# Patient Record
Sex: Female | Born: 1975 | Race: Black or African American | Hispanic: No | Marital: Single | Smoking: Never smoker
Health system: Southern US, Community
[De-identification: ages and names within clinical notes are randomized; demographics above are authoritative.]

## PROBLEM LIST (undated history)

## (undated) ENCOUNTER — Inpatient Hospital Stay (HOSPITAL_COMMUNITY): Payer: Self-pay

## (undated) DIAGNOSIS — R011 Cardiac murmur, unspecified: Secondary | ICD-10-CM

## (undated) DIAGNOSIS — K219 Gastro-esophageal reflux disease without esophagitis: Secondary | ICD-10-CM

## (undated) DIAGNOSIS — J45909 Unspecified asthma, uncomplicated: Secondary | ICD-10-CM

## (undated) DIAGNOSIS — D219 Benign neoplasm of connective and other soft tissue, unspecified: Secondary | ICD-10-CM

## (undated) HISTORY — DX: Gastro-esophageal reflux disease without esophagitis: K21.9

## (undated) HISTORY — DX: Cardiac murmur, unspecified: R01.1

## (undated) HISTORY — PX: OTHER SURGICAL HISTORY: SHX169

## (undated) HISTORY — PX: UPPER GASTROINTESTINAL ENDOSCOPY: SHX188

---

## 2005-06-25 ENCOUNTER — Emergency Department (HOSPITAL_COMMUNITY): Admission: EM | Admit: 2005-06-25 | Discharge: 2005-06-25 | Payer: Self-pay | Admitting: Family Medicine

## 2005-07-25 ENCOUNTER — Other Ambulatory Visit: Admission: RE | Admit: 2005-07-25 | Discharge: 2005-07-25 | Payer: Self-pay | Admitting: Obstetrics and Gynecology

## 2006-02-22 ENCOUNTER — Inpatient Hospital Stay (HOSPITAL_COMMUNITY): Admission: RE | Admit: 2006-02-22 | Discharge: 2006-02-24 | Payer: Self-pay | Admitting: Obstetrics and Gynecology

## 2006-03-30 ENCOUNTER — Emergency Department (HOSPITAL_COMMUNITY): Admission: EM | Admit: 2006-03-30 | Discharge: 2006-03-30 | Payer: Self-pay | Admitting: Emergency Medicine

## 2006-04-02 ENCOUNTER — Other Ambulatory Visit: Admission: RE | Admit: 2006-04-02 | Discharge: 2006-04-02 | Payer: Self-pay | Admitting: Obstetrics and Gynecology

## 2007-05-09 ENCOUNTER — Emergency Department (HOSPITAL_COMMUNITY): Admission: EM | Admit: 2007-05-09 | Discharge: 2007-05-09 | Payer: Self-pay | Admitting: Family Medicine

## 2007-06-03 ENCOUNTER — Emergency Department (HOSPITAL_COMMUNITY): Admission: EM | Admit: 2007-06-03 | Discharge: 2007-06-03 | Payer: Self-pay | Admitting: Emergency Medicine

## 2007-10-27 ENCOUNTER — Emergency Department (HOSPITAL_COMMUNITY): Admission: EM | Admit: 2007-10-27 | Discharge: 2007-10-27 | Payer: Self-pay | Admitting: Family Medicine

## 2008-09-23 ENCOUNTER — Emergency Department (HOSPITAL_COMMUNITY): Admission: EM | Admit: 2008-09-23 | Discharge: 2008-09-23 | Payer: Self-pay | Admitting: Emergency Medicine

## 2009-02-21 ENCOUNTER — Emergency Department (HOSPITAL_COMMUNITY): Admission: EM | Admit: 2009-02-21 | Discharge: 2009-02-21 | Payer: Self-pay | Admitting: Family Medicine

## 2010-03-01 ENCOUNTER — Emergency Department (HOSPITAL_COMMUNITY): Admission: EM | Admit: 2010-03-01 | Discharge: 2010-03-01 | Payer: Self-pay | Admitting: Family Medicine

## 2010-12-11 LAB — POCT URINALYSIS DIP (DEVICE)
Bilirubin Urine: NEGATIVE
Ketones, ur: 40 mg/dL — AB

## 2010-12-11 LAB — WET PREP, GENITAL: Trich, Wet Prep: NONE SEEN

## 2010-12-11 LAB — POCT PREGNANCY, URINE: Preg Test, Ur: NEGATIVE

## 2010-12-11 LAB — RPR: RPR Ser Ql: NONREACTIVE

## 2011-01-19 NOTE — Discharge Summary (Signed)
NAMEARIELLA, Patricia Barron           ACCOUNT NO.:  [id number]   MEDICAL RECORD NO.:  [id number]          PATIENT TYPE:  INP   LOCATION:  9112                          FACILITY:  [facility]   PHYSICIAN:  James A. Ashley Royalty, M.D.DATE OF BIRTH:  01-08-1976   DATE OF ADMISSION:  02/22/2006  DATE OF DISCHARGE:  02/24/2006                                 DISCHARGE SUMMARY   DISCHARGE DIAGNOSES:  1.  Intrauterine pregnancy at term, delivered.  2.  Oligohydramnios.  3.  Group B strep carrier.  4.  Term birth living child, vertex.   OPERATIONS AND PROCEDURES:  OP delivery.   CONSULTATIONS:  None.   DISCHARGE MEDICATIONS:  Motrin.   HISTORY OF PRESENT ILLNESS:  A 35 year old gravida 4, para 3 at 37-1/[redacted] weeks  gestation.  Prenatal care was complicated by small for gestational age  infant and recently by oligohydramnios.  The patient was admitted for  induction secondary to same.  For the remainder of the History and Physical,  please see chart.   HOSPITAL COURSE:  The patient was admitted to [facility]  [place].  Admission laboratory studies were drawn.  Initial cervical  examination revealed 3 cm dilation, 75% effacement, -2 station, vertex  presentation.  Artificial rupture of membranes was accomplished which  revealed clear fluid.  The patient went on to Labor and Delivery on February 22, 2006.  The infant was a 5 pound, 13 ounce female.  Apgars 9 at 1 minute and 9  at 5 minutes, sent to the newborn nursery.  The patient's postpartum course  was benign.  She was discharged on postpartum day #2, afebrile and in  satisfactory condition.   DISPOSITION:  The patient returned to [facility] in 4-6  weeks for postpartum evaluation.      James A. Ashley Royalty, M.D.  Electronically Signed     JAM/MEDQ  D:  03/28/2006  T:  03/28/2006  Job:  [id number]

## 2011-05-25 LAB — POCT URINALYSIS DIP (DEVICE)
Glucose, UA: NEGATIVE
Ketones, ur: NEGATIVE
Operator id: 116391
Specific Gravity, Urine: 1.02
Urobilinogen, UA: 0.2

## 2011-05-25 LAB — GC/CHLAMYDIA PROBE AMP, GENITAL
Chlamydia, DNA Probe: NEGATIVE
GC Probe Amp, Genital: NEGATIVE

## 2011-05-25 LAB — POCT PREGNANCY, URINE: Operator id: 116391

## 2011-05-25 LAB — WET PREP, GENITAL

## 2011-06-14 LAB — POCT URINALYSIS DIP (DEVICE)
Bilirubin Urine: NEGATIVE
Hgb urine dipstick: NEGATIVE
Nitrite: NEGATIVE
Specific Gravity, Urine: 1.02
pH: 7

## 2011-06-14 LAB — WET PREP, GENITAL
Clue Cells Wet Prep HPF POC: NONE SEEN
Trich, Wet Prep: NONE SEEN
Yeast Wet Prep HPF POC: NONE SEEN

## 2011-06-14 LAB — POCT PREGNANCY, URINE: Operator id: 239701

## 2011-06-14 LAB — GC/CHLAMYDIA PROBE AMP, GENITAL
Chlamydia, DNA Probe: NEGATIVE
GC Probe Amp, Genital: NEGATIVE

## 2011-06-15 LAB — WET PREP, GENITAL: Trich, Wet Prep: NONE SEEN

## 2011-06-15 LAB — GC/CHLAMYDIA PROBE AMP, GENITAL
Chlamydia, DNA Probe: NEGATIVE
GC Probe Amp, Genital: NEGATIVE

## 2012-07-21 ENCOUNTER — Emergency Department (INDEPENDENT_AMBULATORY_CARE_PROVIDER_SITE_OTHER)
Admission: EM | Admit: 2012-07-21 | Discharge: 2012-07-21 | Disposition: A | Discharge status: Home / Self Care | Payer: BC Managed Care – PPO | Source: Home / Self Care | Attending: Emergency Medicine | Admitting: Emergency Medicine

## 2012-07-21 ENCOUNTER — Encounter (HOSPITAL_COMMUNITY): Payer: Self-pay | Admitting: Emergency Medicine

## 2012-07-21 DIAGNOSIS — N949 Unspecified condition associated with female genital organs and menstrual cycle: Secondary | ICD-10-CM

## 2012-07-21 DIAGNOSIS — Z3201 Encounter for pregnancy test, result positive: Secondary | ICD-10-CM

## 2012-07-21 DIAGNOSIS — O26899 Other specified pregnancy related conditions, unspecified trimester: Secondary | ICD-10-CM

## 2012-07-21 DIAGNOSIS — N898 Other specified noninflammatory disorders of vagina: Secondary | ICD-10-CM

## 2012-07-21 LAB — POCT URINALYSIS DIP (DEVICE)
Bilirubin Urine: NEGATIVE
Glucose, UA: NEGATIVE mg/dL
Ketones, ur: 15 mg/dL — AB
Leukocytes, UA: NEGATIVE
Specific Gravity, Urine: 1.02 (ref 1.005–1.030)

## 2012-07-21 LAB — POCT PREGNANCY, URINE: Preg Test, Ur: POSITIVE — AB

## 2012-07-21 NOTE — ED Notes (Signed)
Pt having abdominal cramping pain for 2 days that are getting worse. Pt also reports urinary urgency, but not dysuria. Pt states her cycle seems different than usual this month.

## 2012-07-21 NOTE — ED Provider Notes (Signed)
History     CSN: [id number]  Arrival date & time 07/21/12  1239   First MD Initiated Contact with Patient 07/21/12 1305      Chief Complaint  Patient presents with  . Abdominal Pain    (Consider location/radiation/quality/duration/timing/severity/associated sxs/prior treatment) HPI Comments: 36 year old female presents with right pelvic pain for 2 days. It is intermittent and sharp. There is no associated vaginal bleeding but there is a vaginal discharge. She denies any urinary symptoms. Her last menstrual period was 06/12/2012. The floor last to 3 days. Her pregnancy test is positive today.  Patient is a 36 y.o. female presenting with abdominal pain.  Abdominal Pain The primary symptoms of the illness include abdominal pain and vaginal discharge. The primary symptoms of the illness do not include dysuria or vaginal bleeding.  The vaginal discharge is not associated with dysuria.   Symptoms associated with the illness do not include hematuria or frequency.    History reviewed. No pertinent past medical history.  History reviewed. No pertinent past surgical history.  History reviewed. No pertinent family history.  History  Substance Use Topics  . Smoking status: Not on file  . Smokeless tobacco: Not on file  . Alcohol Use: No    OB History    Grav Para Term Preterm Abortions TAB SAB Ect Mult Living                  Review of Systems  Constitutional: Negative.   Respiratory: Negative.   Cardiovascular: Negative.   Gastrointestinal: Positive for abdominal pain.  Genitourinary: Positive for vaginal discharge, menstrual problem and pelvic pain. Negative for dysuria, frequency, hematuria, vaginal bleeding and difficulty urinating.  Musculoskeletal: Negative.   Neurological: Negative.     Allergies  Review of patient's allergies indicates no known allergies.  Home Medications  No current outpatient prescriptions on file.  BP 120/59  Pulse 78  Temp 98.8 F  (37.1 C) (Oral)  Resp 16  SpO2 100%  LMP 06/12/2012  Physical Exam  Nursing note and vitals reviewed. Constitutional: She is oriented to person, place, and time. She appears well-developed and well-nourished. No distress.  Neck: Normal range of motion. Neck supple.  Cardiovascular: Normal rate and normal heart sounds.   Pulmonary/Chest: Effort normal and breath sounds normal. No respiratory distress.  Abdominal: Soft. She exhibits no distension and no mass. There is no rebound and no guarding.       No abdominal tenderness however there is tenderness in the right and mid pelvis.  Neurological: She is alert and oriented to person, place, and time. No cranial nerve deficit.  Skin: Skin is warm and dry.    ED Course  Procedures (including critical care time)  Labs Reviewed  POCT URINALYSIS DIP (DEVICE) - Abnormal; Notable for the following:    Ketones, ur 15 (*)     All other components within normal limits  POCT PREGNANCY, URINE - Abnormal; Notable for the following:    Preg Test, Ur POSITIVE (*)     All other components within normal limits   No results found.   1. Pregnancy test-positive   2. Pelvic pain in pregnancy   3. Vaginal discharge in pregnancy       MDM  The patient is discharged instructed to go to the [facility] hospital now. She is also instructed not to delay but to drive straight there. She is stable, alert and does not complain of weakness, dizziness or other symptoms that would be unsafe or driving.  Hayden Rasmussen, NP 07/21/12 1440

## 2012-07-22 ENCOUNTER — Inpatient Hospital Stay (HOSPITAL_COMMUNITY)
Admission: AD | Admit: 2012-07-22 | Discharge: 2012-07-22 | Disposition: A | Discharge status: Home / Self Care | Payer: BC Managed Care – PPO | Source: Ambulatory Visit | Attending: Obstetrics & Gynecology | Admitting: Obstetrics & Gynecology

## 2012-07-22 ENCOUNTER — Encounter (HOSPITAL_COMMUNITY): Payer: Self-pay

## 2012-07-22 ENCOUNTER — Inpatient Hospital Stay (HOSPITAL_COMMUNITY): Payer: BC Managed Care – PPO

## 2012-07-22 DIAGNOSIS — R1031 Right lower quadrant pain: Secondary | ICD-10-CM | POA: Insufficient documentation

## 2012-07-22 DIAGNOSIS — O26899 Other specified pregnancy related conditions, unspecified trimester: Secondary | ICD-10-CM

## 2012-07-22 DIAGNOSIS — B9689 Other specified bacterial agents as the cause of diseases classified elsewhere: Secondary | ICD-10-CM | POA: Insufficient documentation

## 2012-07-22 DIAGNOSIS — N760 Acute vaginitis: Secondary | ICD-10-CM | POA: Insufficient documentation

## 2012-07-22 DIAGNOSIS — A499 Bacterial infection, unspecified: Secondary | ICD-10-CM | POA: Insufficient documentation

## 2012-07-22 DIAGNOSIS — R109 Unspecified abdominal pain: Secondary | ICD-10-CM

## 2012-07-22 DIAGNOSIS — Z3490 Encounter for supervision of normal pregnancy, unspecified, unspecified trimester: Secondary | ICD-10-CM

## 2012-07-22 DIAGNOSIS — O2390 Unspecified genitourinary tract infection in pregnancy, unspecified trimester: Secondary | ICD-10-CM | POA: Insufficient documentation

## 2012-07-22 HISTORY — DX: Unspecified asthma, uncomplicated: J45.909

## 2012-07-22 LAB — CBC
HCT: 37.3 % (ref 36.0–46.0)
Hemoglobin: 12.9 g/dL (ref 12.0–15.0)
MCHC: 34.6 g/dL (ref 30.0–36.0)
RBC: 4.42 MIL/uL (ref 3.87–5.11)
WBC: 10.7 10*3/uL — ABNORMAL HIGH (ref 4.0–10.5)

## 2012-07-22 LAB — HCG, QUANTITATIVE, PREGNANCY: hCG, Beta Chain, Quant, S: 15499 m[IU]/mL — ABNORMAL HIGH (ref <5)

## 2012-07-22 LAB — ABO/RH: ABO/RH(D): O POS

## 2012-07-22 LAB — WET PREP, GENITAL: Yeast Wet Prep HPF POC: NONE SEEN

## 2012-07-22 MED ORDER — METRONIDAZOLE 500 MG PO TABS: 500.0000 mg | ORAL_TABLET | Freq: Two times a day (BID) | ORAL | Status: DC

## 2012-07-22 NOTE — MAU Provider Note (Signed)
History     CSN: [id number]  Arrival date and time: 07/22/12 5621   First Provider Initiated Contact with Patient 07/22/12 [phone]      Chief Complaint  Patient presents with  . Abdominal Pain   HPIPriscilla R Barron is 36 y.o. [id number] [redacted]w[redacted]d weeks presenting with right lower quadrant pain that began 2-3 days ago.  She was seen [facility] yesterday with + UPT.  Instructed to come straight here for evaluation but she had to pick her children up so she came in today.  Pain is the same as yesterday.  Denies vaginal bleeding or abnormal vaginal discharge.  1 partner X 2 months, not using contraception, unplanned pregnancy.  Does not plan to continue pregnancy. Plans to get Depo at [facility] after termination.   Past Medical History  Diagnosis Date  . Asthma    No past surgical history on file.  No family history on file.  History  Substance Use Topics  . Smoking status: Not on file  . Smokeless tobacco: Not on file  . Alcohol Use: No   Allergies: No Known Allergies  No prescriptions prior to admission   Review of Systems  Constitutional: Negative.   HENT: Negative.   Respiratory: Negative.   Cardiovascular: Negative.   Gastrointestinal: Positive for abdominal pain. Diarrhea: right sided pain.  Genitourinary:       Right lower quadrant  Neg for vaginal bleeding or abnormal   Physical Exam   Blood pressure 123/78, temperature 97.6 F (36.4 C), temperature source Oral, resp. rate 18, height 5\' 1"  (1.549 m), weight 141 lb (63.957 kg), last menstrual period 06/12/2012.  Physical Exam  Constitutional: She is oriented to person, place, and time. She appears well-developed and well-nourished. No distress.  HENT:  Head: Normocephalic.  Neck: Normal range of motion.  Cardiovascular: Normal rate.   Respiratory: Effort normal.  GI: Soft. She exhibits no distension and no mass. There is no tenderness. There is no rebound and no guarding.  Genitourinary: Uterus is enlarged (soft, small) and  tender (mild fundal tenderness). Right adnexum displays no mass, no tenderness and no fullness. Left adnexum displays tenderness (slight). Left adnexum displays no mass and no fullness. No tenderness around the vagina. Vaginal discharge (small amount of white discharge-no odor-normal appearing) found.  Neurological: She is alert and oriented to person, place, and time.  Skin: Skin is warm and dry.  Psychiatric: She has a normal mood and affect. Her behavior is normal.   Results for orders placed during the hospital encounter of 07/22/12 (from the past 24 hour(s))  CBC     Status: Abnormal   Collection Time   07/22/12  8:41 AM      Component Value Range   WBC 10.7 (*) 4.0 - 10.5 K/uL   RBC 4.42  3.87 - 5.11 MIL/uL   Hemoglobin 12.9  12.0 - 15.0 g/dL   HCT 29.5  28.4 - 13.2 %   MCV 84.4  78.0 - 100.0 fL   MCH 29.2  26.0 - 34.0 pg   MCHC 34.6  30.0 - 36.0 g/dL   RDW 44.0  10.2 - 72.5 %   Platelets 231  150 - 400 K/uL  ABO/RH     Status: Normal   Collection Time   07/22/12  8:42 AM      Component Value Range   ABO/RH(D) O POS    HCG, QUANTITATIVE, PREGNANCY     Status: Abnormal   Collection Time   07/22/12  8:43 AM  Component Value Range   hCG, Beta Chain, Quant, S 15499 (*) <5 mIU/mL  WET PREP, GENITAL     Status: Abnormal   Collection Time   07/22/12  8:53 AM      Component Value Range   Yeast Wet Prep HPF POC NONE SEEN  NONE SEEN   Trich, Wet Prep NONE SEEN  NONE SEEN   Clue Cells Wet Prep HPF POC FEW (*) NONE SEEN   WBC, Wet Prep HPF POC FEW (*) NONE SEEN    MAU Course  Procedures  GC/CHL to lab  MDM  Assessment and Plan  A:  Intrauterine Pregnancy at [redacted]w[redacted]d     Abdominal pain    Bacterial Vaginosis  P:  Rx for Flagyl 500mg  bid X 1 week      Patient plans termination.  If changes her mind, begin prenatal care with MD of her choice   Shaleigh Laubscher,EVE M 07/22/2012, 8:42 AM

## 2012-07-22 NOTE — MAU Note (Signed)
Was seen at [facility] yesterday with abdominal pain positive pregnancy test still having abdominal pain sharp at times, no vaginal bleeding, vaginal discharge.

## 2012-07-22 NOTE — ED Provider Notes (Signed)
Medical screening examination/treatment/procedure(s) were performed by resident physician or non-physician practitioner and as supervising physician I was immediately available for consultation/collaboration.   Hollee Fate DOUGLAS MD.    Raef Sprigg D Zabrina Brotherton, MD 07/22/12 1044 

## 2013-05-27 ENCOUNTER — Encounter (HOSPITAL_COMMUNITY): Payer: Self-pay | Admitting: *Deleted

## 2013-08-09 ENCOUNTER — Emergency Department (HOSPITAL_BASED_OUTPATIENT_CLINIC_OR_DEPARTMENT_OTHER)
Admission: EM | Admit: 2013-08-09 | Discharge: 2013-08-09 | Disposition: A | Discharge status: Home / Self Care | Payer: BC Managed Care – PPO | Attending: Emergency Medicine | Admitting: Emergency Medicine

## 2013-08-09 ENCOUNTER — Encounter (HOSPITAL_BASED_OUTPATIENT_CLINIC_OR_DEPARTMENT_OTHER): Payer: Self-pay | Admitting: Emergency Medicine

## 2013-08-09 DIAGNOSIS — J45909 Unspecified asthma, uncomplicated: Secondary | ICD-10-CM | POA: Insufficient documentation

## 2013-08-09 DIAGNOSIS — K089 Disorder of teeth and supporting structures, unspecified: Secondary | ICD-10-CM | POA: Insufficient documentation

## 2013-08-09 DIAGNOSIS — K029 Dental caries, unspecified: Secondary | ICD-10-CM | POA: Insufficient documentation

## 2013-08-09 MED ORDER — PENICILLIN V POTASSIUM 500 MG PO TABS: 500.0000 mg | ORAL_TABLET | Freq: Three times a day (TID) | ORAL | Status: DC

## 2013-08-09 MED ORDER — HYDROCODONE-ACETAMINOPHEN 5-325 MG PO TABS: 2.0000 | ORAL_TABLET | ORAL | Status: DC | PRN

## 2013-08-09 NOTE — ED Provider Notes (Signed)
CSN: [id number]     Arrival date & time 08/09/13  0944 History   First MD Initiated Contact with Patient 08/09/13 1034     Chief Complaint  Patient presents with  . Dental Pain   (Consider location/radiation/quality/duration/timing/severity/associated sxs/prior Treatment) Patient is a 37 y.o. female presenting with tooth pain. The history is provided by the patient.  Dental Pain Location:  Upper Upper teeth location:  16/LU 3rd molar Quality:  Throbbing Severity:  Severe Onset quality:  Gradual Duration:  2 days Timing:  Constant Progression:  Worsening Chronicity:  New Context: dental caries   Context: not abscess   Relieved by:  Nothing Worsened by:  Nothing tried Ineffective treatments:  None tried   Past Medical History  Diagnosis Date  . Asthma    History reviewed. No pertinent past surgical history. No family history on file. History  Substance Use Topics  . Smoking status: Never Smoker   . Smokeless tobacco: Not on file  . Alcohol Use: No   OB History   Grav Para Term Preterm Abortions TAB SAB Ect Mult Living   5 4 4       4      Review of Systems  All other systems reviewed and are negative.    Allergies  Review of patient's allergies indicates no known allergies.  Home Medications  No current outpatient prescriptions on file. BP 132/83  Pulse 71  Temp(Src) 98.6 F (37 C) (Oral)  Resp 18  Ht 5\' 1"  (1.549 m)  Wt 129 lb (58.514 kg)  BMI 24.39 kg/m2  SpO2 100% Physical Exam  Vitals reviewed. Constitutional: She is oriented to person, place, and time. She appears well-developed and well-nourished. No distress.  HENT:  Head: Normocephalic and atraumatic.  Mouth/Throat: Oropharynx is clear and moist.  The left upper third molar is noted to be decayed with surrounding gingival inflammation. There is no definite abscess. There is no facial swelling.  Neck: Normal range of motion. Neck supple.  Musculoskeletal: Normal range of motion. She exhibits  no edema.  Lymphadenopathy:    She has no cervical adenopathy.  Neurological: She is oriented to person, place, and time.  Skin: Skin is warm and dry. She is not diaphoretic.    ED Course  Procedures (including critical care time) Labs Review Labs Reviewed - No data to display Imaging Review No results found.    MDM  No diagnosis found. We'll treat with antibiotics and pain medication and dentistry followup.    Geoffery Lyons, MD 08/09/13 1039

## 2013-08-09 NOTE — ED Notes (Signed)
Patient here with left upper toothache since Friday, this am became intolerable. Taking ibuprofen with minimal relief. On assessment gum is red and swollen.

## 2013-09-10 ENCOUNTER — Ambulatory Visit (INDEPENDENT_AMBULATORY_CARE_PROVIDER_SITE_OTHER): Payer: No Typology Code available for payment source | Admitting: Family Medicine

## 2013-09-10 ENCOUNTER — Encounter: Payer: Self-pay | Admitting: Family Medicine

## 2013-09-10 VITALS — BP 114/71 | HR 75 | Temp 98.4°F | Resp 16 | Ht 61.0 in | Wt 144.0 lb

## 2013-09-10 DIAGNOSIS — J4520 Mild intermittent asthma, uncomplicated: Secondary | ICD-10-CM | POA: Insufficient documentation

## 2013-09-10 DIAGNOSIS — Z124 Encounter for screening for malignant neoplasm of cervix: Secondary | ICD-10-CM

## 2013-09-10 DIAGNOSIS — B9689 Other specified bacterial agents as the cause of diseases classified elsewhere: Secondary | ICD-10-CM

## 2013-09-10 DIAGNOSIS — N760 Acute vaginitis: Secondary | ICD-10-CM

## 2013-09-10 DIAGNOSIS — Z23 Encounter for immunization: Secondary | ICD-10-CM

## 2013-09-10 DIAGNOSIS — J45909 Unspecified asthma, uncomplicated: Secondary | ICD-10-CM

## 2013-09-10 DIAGNOSIS — Z1231 Encounter for screening mammogram for malignant neoplasm of breast: Secondary | ICD-10-CM

## 2013-09-10 DIAGNOSIS — Z01419 Encounter for gynecological examination (general) (routine) without abnormal findings: Secondary | ICD-10-CM

## 2013-09-10 DIAGNOSIS — Z0000 Encounter for general adult medical examination without abnormal findings: Secondary | ICD-10-CM

## 2013-09-10 LAB — POCT WET PREP WITH KOH
KOH Prep POC: NEGATIVE
TRICHOMONAS UA: NEGATIVE
YEAST WET PREP PER HPF POC: NEGATIVE

## 2013-09-10 LAB — COMPLETE METABOLIC PANEL WITH GFR
ALT: 15 U/L (ref 0–35)
AST: 16 U/L (ref 0–37)
Albumin: 4.1 g/dL (ref 3.5–5.2)
Alkaline Phosphatase: 70 U/L (ref 39–117)
BUN: 10 mg/dL (ref 6–23)
CHLORIDE: 102 meq/L (ref 96–112)
CO2: 27 mEq/L (ref 19–32)
Calcium: 9.2 mg/dL (ref 8.4–10.5)
Creat: 0.79 mg/dL (ref 0.50–1.10)
Glucose, Bld: 74 mg/dL (ref 70–99)
POTASSIUM: 4.1 meq/L (ref 3.5–5.3)
Sodium: 138 mEq/L (ref 135–145)
TOTAL PROTEIN: 7.9 g/dL (ref 6.0–8.3)
Total Bilirubin: 0.4 mg/dL (ref 0.3–1.2)

## 2013-09-10 LAB — POCT URINALYSIS DIPSTICK
Bilirubin, UA: NEGATIVE
Glucose, UA: NEGATIVE
Ketones, UA: NEGATIVE
NITRITE UA: NEGATIVE
PH UA: 6
PROTEIN UA: NEGATIVE
Spec Grav, UA: 1.02
UROBILINOGEN UA: 0.2

## 2013-09-10 LAB — CBC WITH DIFFERENTIAL/PLATELET
BASOS ABS: 0 10*3/uL (ref 0.0–0.1)
Basophils Relative: 1 % (ref 0–1)
Eosinophils Absolute: 0.2 10*3/uL (ref 0.0–0.7)
Eosinophils Relative: 2 % (ref 0–5)
HEMATOCRIT: 40 % (ref 36.0–46.0)
HEMOGLOBIN: 13.6 g/dL (ref 12.0–15.0)
LYMPHS ABS: 3.2 10*3/uL (ref 0.7–4.0)
LYMPHS PCT: 38 % (ref 12–46)
MCH: 29.4 pg (ref 26.0–34.0)
MCHC: 34 g/dL (ref 30.0–36.0)
MCV: 86.4 fL (ref 78.0–100.0)
MONO ABS: 0.6 10*3/uL (ref 0.1–1.0)
Monocytes Relative: 7 % (ref 3–12)
NEUTROS ABS: 4.4 10*3/uL (ref 1.7–7.7)
Neutrophils Relative %: 52 % (ref 43–77)
Platelets: 252 10*3/uL (ref 150–400)
RBC: 4.63 MIL/uL (ref 3.87–5.11)
RDW: 14.9 % (ref 11.5–15.5)
WBC: 8.4 10*3/uL (ref 4.0–10.5)

## 2013-09-10 LAB — LIPID PANEL
CHOLESTEROL: 170 mg/dL (ref 0–200)
HDL: 45 mg/dL (ref >39)
LDL CALC: 109 mg/dL — AB (ref 0–99)
TRIGLYCERIDES: 78 mg/dL (ref <150)
Total CHOL/HDL Ratio: 3.8 Ratio
VLDL: 16 mg/dL (ref 0–40)

## 2013-09-10 MED ORDER — ALBUTEROL SULFATE HFA 108 (90 BASE) MCG/ACT IN AERS: 2.0000 | INHALATION_SPRAY | Freq: Four times a day (QID) | RESPIRATORY_TRACT | Status: DC | PRN

## 2013-09-10 MED ORDER — METRONIDAZOLE 0.75 % VA GEL: 1.0000 | Freq: Two times a day (BID) | VAGINAL | Status: DC

## 2013-09-10 NOTE — Progress Notes (Signed)
Subjective:    Patient ID: Patricia Barron, female    DOB: 1975-11-30, 38 y.o.   MRN: [id number]  HPI  This 38 y.o. AA female is new to [facility] and presents for CPE w/ PAP. Last exam > 4-5 years ago with normal results. Abnormal PAP ~ 20 years ago; s/p conization.  OB history: 4 preg and 1 abortion. Youngest child is 83 years old. Never had MMG.  Pt has mild intermittent Asthma, requiring only prn MDI use (strong odors and cold weather). Menses are regular, lasting 3-4 days. She is single and currently not sexually active.  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  Review of Systems  Constitutional: Negative.   HENT: Negative.   Eyes: Negative.   Respiratory: Negative.   Cardiovascular: Negative.   Gastrointestinal: Negative.   Endocrine: Positive for cold intolerance.  Genitourinary: Negative.   Musculoskeletal: Negative.   Skin: Negative.   Allergic/Immunologic: Negative.   Neurological: Negative.   Hematological: Negative.   Psychiatric/Behavioral: Negative.        Objective:   Physical Exam  Nursing note and vitals reviewed. Constitutional: She is oriented to person, place, and time. Vital signs are normal. She appears well-developed and well-nourished. No distress.  HENT:  Head: Normocephalic and atraumatic.  Right Ear: Hearing, tympanic membrane, external ear and ear canal normal.  Left Ear: Hearing, tympanic membrane, external ear and ear canal normal.  Nose: Nose normal. No nasal deformity or septal deviation.  Mouth/Throat: Uvula is midline, oropharynx is clear and moist and mucous membranes are normal. No oral lesions. Normal dentition. No dental caries.  Eyes: Conjunctivae, EOM and lids are normal. Pupils are equal, round, and reactive to light. No scleral icterus.  Fundoscopic exam:      The right eye shows no arteriolar narrowing, no AV nicking and no papilledema. The right eye shows red reflex.       The left eye shows no arteriolar narrowing, no  AV nicking and no papilledema. The left eye shows red reflex.  Neck: Trachea normal, normal range of motion and full passive range of motion without pain. Neck supple. No JVD present. No spinous process tenderness and no muscular tenderness present. Thyromegaly present. No mass present.  Cardiovascular: Normal rate, regular rhythm, S1 normal, S2 normal, normal heart sounds and normal pulses.   No extrasystoles are present. PMI is not displaced.  Exam reveals no gallop and no friction rub.   No murmur heard. Pulmonary/Chest: Effort normal and breath sounds normal. No respiratory distress. She has no decreased breath sounds. She has no wheezes. Right breast exhibits no inverted nipple, no mass, no nipple discharge, no skin change and no tenderness. Left breast exhibits no inverted nipple, no mass, no nipple discharge, no skin change and no tenderness. Breasts are symmetrical.  Abdominal: Soft. Normal appearance and bowel sounds are normal. She exhibits no distension and no mass. There is no hepatosplenomegaly. There is no tenderness. There is no guarding and no CVA tenderness.  Genitourinary: Rectum normal. There is no rash, tenderness or lesion on the right labia. There is no rash, tenderness or lesion on the left labia. Uterus is enlarged and tender. Cervix exhibits discharge. Cervix exhibits no motion tenderness and no friability. Right adnexum displays no mass and no fullness. Left adnexum displays no mass, no tenderness and no fullness. No erythema, tenderness or bleeding around the vagina. No foreign body around the vagina. No signs of injury around the vagina. Vaginal discharge found.  Musculoskeletal: Normal range of motion. She exhibits no edema and no tenderness.  Lymphadenopathy:       Right: No inguinal adenopathy present.       Left: No inguinal adenopathy present.  Neurological: She is alert and oriented to person, place, and time. She has normal strength and normal reflexes. She displays  no atrophy and no tremor. No cranial nerve deficit or sensory deficit. She exhibits normal muscle tone. Coordination and gait normal.  Skin: Skin is warm, dry and intact. No ecchymosis, no lesion and no rash noted. She is not diaphoretic. No cyanosis or erythema. No pallor. Nails show no clubbing.  Psychiatric: She has a normal mood and affect. Her speech is normal and behavior is normal. Judgment and thought content normal. Cognition and memory are normal.     Results for orders placed in visit on 09/10/13  POCT URINALYSIS DIPSTICK      Result Value Range   Color, UA yellow     Clarity, UA clear     Glucose, UA neg     Bilirubin, UA neg     Ketones, UA neg     Spec Grav, UA 1.020     Blood, UA trace     pH, UA 6.0     Protein, UA neg     Urobilinogen, UA 0.2     Nitrite, UA neg     Leukocytes, UA small (1+)    POCT WET PREP WITH KOH      Result Value Range   Trichomonas, UA Negative     Clue Cells Wet Prep HPF POC tntc     Epithelial Wet Prep HPF POC tntc     Yeast Wet Prep HPF POC neg     Bacteria Wet Prep HPF POC 1+     RBC Wet Prep HPF POC 0-2     WBC Wet Prep HPF POC 0-10     KOH Prep POC Negative         Assessment & Plan:  Routine general medical examination at a health care facility - Plan: POCT urinalysis dipstick, Lipid panel, TSH, T4, free, T3, Free, CBC with Differential, COMPLETE METABOLIC PANEL WITH GFR, POCT Wet Prep with KOH  Encounter for cervical Pap smear with pelvic exam - Plan: Pap IG, CT/NG w/ reflex HPV when ASC-U, POCT Wet Prep with KOH  Asthma, mild intermittent- RX: Albuterol MDI for prn use. If symptoms persists, pt advised to return to get prescription for MDI for daily use.  BV (bacterial vaginosis)  Other screening mammogram - Plan: MM Digital Screening  Need for Tdap vaccination - Plan: Tdap vaccine greater than or equal to 7yo IM  Meds ordered this encounter  Medications  . albuterol (PROVENTIL HFA;VENTOLIN HFA) 108 (90 BASE) MCG/ACT  inhaler    Sig: Inhale 2 puffs into the lungs every 6 (six) hours as needed for wheezing or shortness of breath.    Dispense:  1 Inhaler    Refill:  5  . metroNIDAZOLE (METROGEL VAGINAL) 0.75 % vaginal gel    Sig: Place 1 Applicatorful vaginally 2 (two) times daily.    Dispense:  70 g    Refill:  0

## 2013-09-10 NOTE — Patient Instructions (Addendum)
Keeping You Healthy  Get These Tests 1. Blood Pressure- Have your blood pressure checked once a year by your health care provider.  Normal blood pressure is 120/80. 2. Weight- Have your body mass index (BMI) calculated to screen for obesity.  BMI is measure of body fat based on height and weight.  You can also calculate your own BMI at GravelBags[employer]it. 3. Cholesterol- Have your cholesterol checked every 5 years starting at age 39 then yearly starting at age 39. 45. Chlamydia, HIV, and other sexually transmitted diseases- Get screened every year until age 53, then within three months of each new sexual provider. 5. Pap Smear- Every 1-3 years; discuss with your health care provider. 6. Mammogram- Every year starting at age 26  Take these medicines  Calcium with Vitamin D-Your body needs 1200 mg of Calcium each day and [id number] IU of Vitamin D daily.  Your body can only absorb 500 mg of Calcium at a time so Calcium must be taken in 2 or 3 divided doses throughout the day.  Multivitamin with folic acid- Once daily if it is possible for you to become pregnant.  Get these Immunizations  Tetanus shot- Every 10 years. Today you received the Tdap; next Tetanus due in 2025.  Flu shot- Every year.  Take these steps 1. Do not smoke-Your healthcare provider can help you quit.  For tips on how to quit go to www.smokefree.gov or call 1-800 QUITNOW. 2. Be physically active- Exercise 5 days a week for at least 30 minutes.  If you are not already physically active, start slow and gradually work up to 30 minutes of moderate physical activity.  Examples of moderate activity include walking briskly, dancing, swimming, bicycling, etc. 3. Breast Cancer- A self breast exam every month is important for early detection of breast cancer.  For more information and instruction on self breast exams, ask your healthcare provider or https://www.patel.info/. 4. Eat a healthy diet- Eat a  variety of healthy foods such as fruits, vegetables, whole grains, low fat milk, low fat cheeses, yogurt, lean meats, poultry and fish, beans, nuts, tofu, etc.  For more information go to www. Thenutritionsource.org 5. Drink alcohol in moderation- Limit alcohol intake to one drink or less per day. Never drink and drive. 6. Depression- Your emotional health is as important as your physical health.  If you're feeling down or losing interest in things you normally enjoy please talk to your healthcare provider about being screened for depression. 7. Dental visit- Brush and floss your teeth twice daily; visit your dentist twice a year. 8. Eye doctor- Get an eye exam at least every 2 years. 9. Helmet use- Always wear a helmet when riding a bicycle, motorcycle, rollerblading or skateboarding. 83. Safe sex- If you may be exposed to sexually transmitted infections, use a condom. 11. Seat belts- Seat belts can save your live; always wear one. 12. Smoke/Carbon Monoxide detectors- These detectors need to be installed on the appropriate level of your home. Replace batteries at least once a year. 13. Skin cancer- When out in the sun please cover up and use sunscreen 15 SPF or higher. 14. Violence- If anyone is threatening or hurting you, please tell your healthcare provider.    Asthma, Acute Bronchospasm Acute bronchospasm caused by asthma is also referred to as an asthma attack. Bronchospasm means your air passages become narrowed. The narrowing is caused by inflammation and tightening of the muscles in the air tubes (bronchi) in your lungs. This can make it hard  to breath or cause you to wheeze and cough. CAUSES Possible triggers are:  Animal dander from the skin, hair, or feathers of animals.  Dust mites contained in house dust.  Cockroaches.  Pollen from trees or grass.  Mold.  Cigarette or tobacco smoke.  Air pollutants such as dust, household cleaners, hair sprays, aerosol sprays, paint fumes,  strong chemicals, or strong odors.  Cold air or weather changes. Cold air may trigger inflammation. Winds increase molds and pollens in the air.  Strong emotions such as crying or laughing hard.  Stress.  Certain medicines such as aspirin or beta-blockers.  Sulfites in foods and drinks, such as dried fruits and wine.  Infections or inflammatory conditions, such as a flu, cold, or inflammation of the nasal membranes (rhinitis).  Gastroesophageal reflux disease (GERD). GERD is a condition where stomach acid backs up into your throat (esophagus).  Exercise or strenuous activity. SIGNS AND SYMPTOMS   Wheezing.  Excessive coughing, particularly at night.  Chest tightness.  Shortness of breath. DIAGNOSIS  Your health care provider will ask you about your medical history and perform a physical exam. A chest X-ray or blood testing may be performed to look for other causes of your symptoms or other conditions that may have triggered your asthma attack. TREATMENT  Treatment is aimed at reducing inflammation and opening up the airways in your lungs. Most asthma attacks are treated with inhaled medicines. These include quick relief or rescue medicines (such as bronchodilators) and controller medicines (such as inhaled corticosteroids). These medicines are sometimes given through an inhaler or a nebulizer. Systemic steroid medicine taken by mouth or given through an IV tube also can be used to reduce the inflammation when an attack is moderate or severe. Antibiotic medicines are only used if a bacterial infection is present.  HOME CARE INSTRUCTIONS   Rest.  Drink plenty of liquids. This helps the mucus to remain thin and be easily coughed up. Only use caffeine in moderation and do not use alcohol until you have recovered from your illness.  Do not smoke. Avoid being exposed to secondhand smoke.  You play a critical role in keeping yourself in good health. Avoid exposure to things that  cause you to wheeze or to have breathing problems.  Keep your medicines up to date and available. Carefully follow your health care provider's treatment plan.  Take your medicine exactly as prescribed.  When pollen or pollution is bad, keep windows closed and use an air conditioner or go to places with air conditioning.  Asthma requires careful medical care. See your health care provider for a follow-up as advised. If you are more than [redacted] weeks pregnant and you were prescribed any new medicines, let your obstetrician know about the visit and how you are doing. Follow-up with your health care provider as directed.  After you have recovered from your asthma attack, make an appointment with your outpatient doctor to talk about ways to reduce the likelihood of future attacks. If you do not have a doctor who manages your asthma, make an appointment with a primary care doctor to discuss your asthma. SEEK IMMEDIATE MEDICAL CARE IF:   You are getting worse.  You have trouble breathing. If severe, call your local emergency services (911 in the [place]  You develop chest pain or discomfort.  You are vomiting.  You are not able to keep fluids down.  You are coughing up yellow, green, brown, or bloody sputum.  You have a fever and your  symptoms suddenly get worse.  You have trouble swallowing. MAKE SURE YOU:   Understand these instructions.  Will watch your condition.  Will get help right away if you are not doing well or get worse. Document Released: 12/05/2006 Document Revised: 04/22/2013 Document Reviewed: 02/25/2013 [facility] Patient Information 2014 [place], [place].   Bacterial Vaginosis Bacterial vaginosis is an infection of the vagina. A healthy vagina has many kinds of good germs (bacteria). Sometimes the number of good germs can change. This allows bad germs to move in and cause an infection. You may be given medicine (antibiotics) to treat the infection. Or, you may not need  treatment at all. HOME CARE  Take your medicine as told. Finish them even if you start to feel better.  Do not have sex until you finish your medicine.  Do not douche.  Practice safe sex.  Tell your sex partner that you have an infection. They should see their doctor for treatment if they have problems. GET HELP RIGHT AWAY IF:  You do not get better after 3 days of treatment.  You have grey fluid (discharge) coming from your vagina.  You have pain.  You have a temperature of 102 F (38.9 C) or higher. MAKE SURE YOU:   Understand these instructions.  Will watch your condition.  Will get help right away if you are not doing well or get worse. Document Released: 05/29/2008 Document Revised: 11/12/2011 Document Reviewed: 04/01/2013 [facility] Patient Information 2014 [place].

## 2013-09-11 ENCOUNTER — Encounter: Payer: Self-pay | Admitting: Family Medicine

## 2013-09-11 LAB — T4, FREE: Free T4: 1.23 ng/dL (ref 0.80–1.80)

## 2013-09-11 LAB — T3, FREE: T3 FREE: 3.4 pg/mL (ref 2.3–4.2)

## 2013-09-11 LAB — TSH: TSH: 0.892 u[IU]/mL (ref 0.350–4.500)

## 2013-09-11 NOTE — Progress Notes (Signed)
Quick Note:  Please notify pt that results are normal.   Provide pt with copy of labs. ______ 

## 2013-09-14 LAB — PAP IG, CT-NG, RFX HPV ASCU
CHLAMYDIA PROBE AMP: NEGATIVE
GC PROBE AMP: NEGATIVE

## 2013-09-15 LAB — HUMAN PAPILLOMAVIRUS, HIGH RISK: HPV DNA High Risk: NOT DETECTED

## 2013-09-16 NOTE — Progress Notes (Signed)
Quick Note:  Please advise pt regarding following labs... PAP shows some abnormal cells most likely due to bacterial vaginosis infection. Otherwise, STD screening (for chlamydia and gonorrhea) are negative. ______

## 2013-11-10 ENCOUNTER — Encounter: Payer: Self-pay | Admitting: Family Medicine

## 2013-11-10 ENCOUNTER — Ambulatory Visit (INDEPENDENT_AMBULATORY_CARE_PROVIDER_SITE_OTHER): Payer: No Typology Code available for payment source | Admitting: Family Medicine

## 2013-11-10 VITALS — BP 116/73 | HR 77 | Temp 98.8°F | Resp 16 | Ht 61.0 in | Wt 149.0 lb

## 2013-11-10 DIAGNOSIS — Z3009 Encounter for other general counseling and advice on contraception: Secondary | ICD-10-CM | POA: Diagnosis not present

## 2013-11-10 DIAGNOSIS — Z920 Personal history of contraception: Secondary | ICD-10-CM

## 2013-11-10 DIAGNOSIS — Z3042 Encounter for surveillance of injectable contraceptive: Secondary | ICD-10-CM

## 2013-11-10 DIAGNOSIS — Z3049 Encounter for surveillance of other contraceptives: Secondary | ICD-10-CM | POA: Diagnosis not present

## 2013-11-10 DIAGNOSIS — Z0000 Encounter for general adult medical examination without abnormal findings: Secondary | ICD-10-CM | POA: Insufficient documentation

## 2013-11-10 LAB — POCT URINE PREGNANCY: PREG TEST UR: NEGATIVE

## 2013-11-10 MED ORDER — MEDROXYPROGESTERONE ACETATE 150 MG/ML IM SUSP
150.0000 mg | INTRAMUSCULAR | Status: AC
  Administered 2013-11-23 – 2014-06-14 (×2): 150 mg via INTRAMUSCULAR

## 2013-11-10 NOTE — Patient Instructions (Signed)
There will be a standing order for DepoProvera 150 mg every 3 months.  You do not need to call before coming, just come in to this building or the [address] building next door if it is the weekend and your injection will be administered (from the standing).

## 2013-11-10 NOTE — Progress Notes (Signed)
S:  This 37 y.o. AA female is here to initiate [place] contraception. She used this form of birth control when she was in her late 34s and remained on it for ~5 years. Pt denies any adverse effects and has no contraindications to this medication. Her menses are regular. She is sexually active/ monogamous. She is aware that she needs to use condoms as protection against STDs.  Patient Active Problem List   Diagnosis Date Noted  . Health care maintenance 11/10/2013  . Personal history of contraception 11/10/2013  . Asthma, mild intermittent 09/10/2013   PMHx, Surg Hx, Soc and Fam Hx reviewed.  ROS: AS per HPI. Noncontributory.  O: Filed Vitals:   11/10/13 1424  BP: 116/73  Pulse: 77  Temp: 98.8 F (37.1 C)  Resp: 16   GEN: In NAD: WN,WD. HENT: [place]/AT. EOMI w/ clear conj/sclerae. Otherwise unremarkable. COR: RRR. LUNGS: Unlabored resp. SKIN: W&D; intact w/o rashes, erythema or jaundice. MS: MAEs; no joint deformity or muscle atrophy. NEURO: A&O x 3; CNs intact. Nonfocal.  Results for orders placed in visit on 11/10/13  POCT URINE PREGNANCY      Result Value Ref Range   Preg Test, Ur Negative      A/P: Birth control counseling - Plan: POCT urine pregnancy  Personal history of contraception - Plan: medroxyPROGESTERone (DEPO-PROVERA) injection 150 mg  [place] contraception - Plan: medroxyPROGESTERone (DEPO-PROVERA) injection 150 mg  Pt advised to come in within 5 days after next menses for resumption of DepoProvera. Adherence to scheduled administration encouraged.

## 2013-11-23 ENCOUNTER — Ambulatory Visit (INDEPENDENT_AMBULATORY_CARE_PROVIDER_SITE_OTHER): Payer: No Typology Code available for payment source | Admitting: *Deleted

## 2013-11-23 VITALS — BP 120/70 | Temp 98.4°F

## 2013-11-23 DIAGNOSIS — Z309 Encounter for contraceptive management, unspecified: Secondary | ICD-10-CM

## 2013-11-23 DIAGNOSIS — IMO0001 Reserved for inherently not codable concepts without codable children: Secondary | ICD-10-CM

## 2013-11-23 NOTE — Progress Notes (Signed)
   Subjective:    Patient ID: Patricia Barron, female    DOB: [birth year], 38 y.o.   MRN: [id number]  HPI pt here for 1st depo provera injection only.    Review of Systems     Objective:   Physical Exam        Assessment & Plan:

## 2014-01-06 ENCOUNTER — Ambulatory Visit (INDEPENDENT_AMBULATORY_CARE_PROVIDER_SITE_OTHER): Payer: No Typology Code available for payment source | Admitting: Family Medicine

## 2014-01-06 VITALS — BP 116/68 | HR 72 | Temp 98.1°F | Resp 18 | Ht 61.0 in | Wt 154.0 lb

## 2014-01-06 DIAGNOSIS — R079 Chest pain, unspecified: Secondary | ICD-10-CM

## 2014-01-06 DIAGNOSIS — R0602 Shortness of breath: Secondary | ICD-10-CM

## 2014-01-06 DIAGNOSIS — K219 Gastro-esophageal reflux disease without esophagitis: Secondary | ICD-10-CM

## 2014-01-06 LAB — POCT CBC
Granulocyte percent: 57.2 %G (ref 37–80)
HCT, POC: 38.5 % (ref 37.7–47.9)
Hemoglobin: 12.3 g/dL (ref 12.2–16.2)
LYMPH, POC: 3.6 — AB (ref 0.6–3.4)
MCH: 28.3 pg (ref 27–31.2)
MCHC: 31.9 g/dL (ref 31.8–35.4)
MCV: 88.7 fL (ref 80–97)
MID (CBC): 0.6 (ref 0–0.9)
MPV: 9 fL (ref 0–99.8)
PLATELET COUNT, POC: 245 10*3/uL (ref 142–424)
POC Granulocyte: 5.6 (ref 2–6.9)
POC LYMPH %: 36.4 % (ref 10–50)
POC MID %: 6.4 % (ref 0–12)
RBC: 4.34 M/uL (ref 4.04–5.48)
RDW, POC: 14.5 %
WBC: 9.8 10*3/uL (ref 4.6–10.2)

## 2014-01-06 MED ORDER — OMEPRAZOLE 40 MG PO CPDR: 40.0000 mg | DELAYED_RELEASE_CAPSULE | Freq: Every day | ORAL | Status: DC

## 2014-01-06 NOTE — Patient Instructions (Addendum)
Start the omeprazole tonight and take again in the morning.  Follow-up for recheck with Dr. Everlene Farrier tomorrow to decide if any chest imaging is necessary.  Gastroesophageal Reflux Disease, Adult Gastroesophageal reflux disease (GERD) happens when acid from your stomach flows up into the esophagus. When acid comes in contact with the esophagus, the acid causes soreness (inflammation) in the esophagus. Over time, GERD may create small holes (ulcers) in the lining of the esophagus. CAUSES   Increased body weight. This puts pressure on the stomach, making acid rise from the stomach into the esophagus.  Smoking. This increases acid production in the stomach.  Drinking alcohol. This causes decreased pressure in the lower esophageal sphincter (valve or ring of muscle between the esophagus and stomach), allowing acid from the stomach into the esophagus.  Late evening meals and a full stomach. This increases pressure and acid production in the stomach.  A malformed lower esophageal sphincter. Sometimes, no cause is found. SYMPTOMS   Burning pain in the lower part of the mid-chest behind the breastbone and in the mid-stomach area. This may occur twice a week or more often.  Trouble swallowing.  Sore throat.  Dry cough.  Asthma-like symptoms including chest tightness, shortness of breath, or wheezing. DIAGNOSIS  Your caregiver may be able to diagnose GERD based on your symptoms. In some cases, X-rays and other tests may be done to check for complications or to check the condition of your stomach and esophagus. TREATMENT  Your caregiver may recommend over-the-counter or prescription medicines to help decrease acid production. Ask your caregiver before starting or adding any new medicines.  HOME CARE INSTRUCTIONS   Change the factors that you can control. Ask your caregiver for guidance concerning weight loss, quitting smoking, and alcohol consumption.  Avoid foods and drinks that make your  symptoms worse, such as:  Caffeine or alcoholic drinks.  Chocolate.  Peppermint or mint flavorings.  Garlic and onions.  Spicy foods.  Citrus fruits, such as oranges, lemons, or limes.  Tomato-based foods such as sauce, chili, salsa, and pizza.  Fried and fatty foods.  Avoid lying down for the 3 hours prior to your bedtime or prior to taking a nap.  Eat small, frequent meals instead of large meals.  Wear loose-fitting clothing. Do not wear anything tight around your waist that causes pressure on your stomach.  Raise the head of your bed 6 to 8 inches with wood blocks to help you sleep. Extra pillows will not help.  Only take over-the-counter or prescription medicines for pain, discomfort, or fever as directed by your caregiver.  Do not take aspirin, ibuprofen, or other nonsteroidal anti-inflammatory drugs (NSAIDs). SEEK IMMEDIATE MEDICAL CARE IF:   You have pain in your arms, neck, jaw, teeth, or back.  Your pain increases or changes in intensity or duration.  You develop nausea, vomiting, or sweating (diaphoresis).  You develop shortness of breath, or you faint.  Your vomit is green, yellow, black, or looks like coffee grounds or blood.  Your stool is red, bloody, or black. These symptoms could be signs of other problems, such as heart disease, gastric bleeding, or esophageal bleeding. MAKE SURE YOU:   Understand these instructions.  Will watch your condition.  Will get help right away if you are not doing well or get worse. Document Released: 05/30/2005 Document Revised: 11/12/2011 Document Reviewed: 03/09/2011 [facility] Patient Information 2014 [place], [place]. Diet for Gastroesophageal Reflux Disease, Adult Reflux (acid reflux) is when acid from your stomach flows up into  the esophagus. When acid comes in contact with the esophagus, the acid causes irritation and soreness (inflammation) in the esophagus. When reflux happens often or so severely that it causes  damage to the esophagus, it is called gastroesophageal reflux disease (GERD). Nutrition therapy can help ease the discomfort of GERD. FOODS OR DRINKS TO AVOID OR LIMIT  Smoking or chewing tobacco. Nicotine is one of the most potent stimulants to acid production in the gastrointestinal tract.  Caffeinated and decaffeinated coffee and black tea.  Regular or low-calorie carbonated beverages or energy drinks (caffeine-free carbonated beverages are allowed).   Strong spices, such as black pepper, white pepper, red pepper, cayenne, curry powder, and chili powder.  Peppermint or spearmint.  Chocolate.  High-fat foods, including meats and fried foods. Extra added fats including oils, butter, salad dressings, and nuts. Limit these to less than 8 tsp per day.  Fruits and vegetables if they are not tolerated, such as citrus fruits or tomatoes.  Alcohol.  Any food that seems to aggravate your condition. If you have questions regarding your diet, call your caregiver or a registered dietitian. OTHER THINGS THAT MAY HELP GERD INCLUDE:   Eating your meals slowly, in a relaxed setting.  Eating 5 to 6 small meals per day instead of 3 large meals.  Eliminating food for a period of time if it causes distress.  Not lying down until 3 hours after eating a meal.  Keeping the head of your bed raised 6 to 9 inches (15 to 23 cm) by using a foam wedge or blocks under the legs of the bed. Lying flat may make symptoms worse.  Being physically active. Weight loss may be helpful in reducing reflux in overweight or obese adults.  Wear loose fitting clothing EXAMPLE MEAL PLAN This meal plan is approximately 2,000 calories based on [employer] meal planning guidelines. Breakfast   cup cooked oatmeal.  1 cup strawberries.  1 cup low-fat milk.  1 oz almonds. Snack  1 cup cucumber slices.  6 oz yogurt (made from low-fat or fat-free milk). Lunch  2 slice whole-wheat bread.  2 oz sliced  [place].  2 tsp mayonnaise.  1 cup blueberries.  1 cup snap peas. Snack  6 whole-wheat crackers.  1 oz string cheese. Dinner   cup brown rice.  1 cup mixed veggies.  1 tsp olive oil.  3 oz grilled fish. Document Released: 08/20/2005 Document Revised: 11/12/2011 Document Reviewed: 07/06/2011 [facility] Patient Information 2014 [place], [place].

## 2014-01-06 NOTE — Progress Notes (Signed)
Subjective:    Patient ID: Patricia Barron, female    DOB: Oct 15, 1975, 38 y.o.   MRN: [id number] This chart was scribed for Patricia Cheadle, MD by Vernell Barrier, Medical Scribe. This patient's care was started at 7:12 PM.  Chief Complaint  Patient presents with  . Chest Pain    x1 day  . Shortness of Breath   Chest Pain  Associated symptoms include a cough and shortness of breath. Pertinent negatives include no abdominal pain, diaphoresis, nausea, palpitations or vomiting.  Shortness of Breath Associated symptoms include chest pain and a rash. Pertinent negatives include no abdominal pain, rhinorrhea, sore throat, vomiting or wheezing.   HPI Comments: Patricia Barron is a 38 y.o. female w/hx of heartburn and mild int asthma presents to [facility] complaining of minor upper bilateral chest pain, onset 7 hours ago. Episodes lasted about 5-10 minutes at a time and resolved on their own. Reports pain turned into chest tightness w/ associated dry cough and SOB, also lasting 5-10 minutes and prompting today's visit. Pain radiated straight through to her back.  No asstd nausea, diaphoresis, or feelings of doom.  No palpitations or wheezing with this tightness.  Tightness still mildly present; states she still feels like she is trying to catch her breath.  Also reports some mild lower extremity edema primarily around the ankles.  Has had a murmur in the past but states she was told it had gone away.  Does not use inhalers at home to treat asthma. Has never needed maintenance inhaler.  Has an appointment sched tomorrow with Dr. Leward Quan to discuss heartburn. States heartburn has been severe over the past couple of days. Has used Zantac to treat with no relief.  It is to the point where she has had some regurgitation of bile.    Has a bug bite above left medial ankle for sev d. Occ pruritic, noticed bruise around it today.  Patient Active Problem List   Diagnosis Date  Noted  . Health care maintenance 11/10/2013  . Personal history of contraception 11/10/2013  . Asthma, mild intermittent 09/10/2013   Past Medical History  Diagnosis Date  . Asthma    History reviewed. No pertinent past surgical history. No Known Allergies Prior to Admission medications   Medication Sig Start Date End Date Taking? Authorizing Provider  albuterol (PROVENTIL HFA;VENTOLIN HFA) 108 (90 BASE) MCG/ACT inhaler Inhale 2 puffs into the lungs every 6 (six) hours as needed for wheezing or shortness of breath. 09/10/13   Barton Fanny, MD   History   Social History  . Marital Status: Single    Spouse Name: N/A    Number of Children: N/A  . Years of Education: N/A   Occupational History  . customer service    Social History Main Topics  . Smoking status: Never Smoker   . Smokeless tobacco: Not on file  . Alcohol Use: No  . Drug Use: No  . Sexual Activity: No   Other Topics Concern  . 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  HENT: Negative for rhinorrhea, sneezing and sore throat.   Respiratory: Positive for cough, chest tightness and shortness of breath. Negative for wheezing.   Cardiovascular: Positive for chest pain. Negative for palpitations.  Gastrointestinal: Negative for nausea, vomiting and abdominal pain.  Skin: Positive for rash. Negative for color change and pallor.   BP 116/68  Pulse 72  Temp(Src) 98.1 F (36.7 C) (Oral)  Resp 18  Ht 5\' 1"  (1.549 m)  Wt 154 lb (69.854 kg)  BMI 29.11 kg/m2  SpO2 99%  LMP 12/21/2013 Objective:  Physical Exam  Vitals reviewed. Constitutional: She is oriented to person, place, and time. She appears well-developed and well-nourished. No distress.  HENT:  Head: Normocephalic and atraumatic.  Eyes: EOM are normal.  Neck: Neck supple. No tracheal deviation present.    Cardiovascular: Normal rate and regular rhythm.   Murmur heard. 1/6 systolic ejection murmur over right upper sternal border. Trace pitting edema bilaterally. 30 cm circumference on the left lower leg. 30.3 cm circumference on the right lower leg.  Pulmonary/Chest: Effort normal and breath sounds normal. No respiratory distress. She has no wheezes. She has no rhonchi. She has no rales.  Lungs clear to auscultation bilaterally. Good air movement.  Musculoskeletal: Normal range of motion.  Lymphadenopathy:    She has no cervical adenopathy.  Neurological: She is alert and oriented to person, place, and time.  Skin: Skin is warm and dry.  Psychiatric: She has a normal mood and affect. Her behavior is normal.   Results for orders placed in visit on 01/06/14  POCT CBC      Result Value Ref Range   WBC 9.8  4.6 - 10.2 K/uL   Lymph, poc 3.6 (*) 0.6 - 3.4   POC LYMPH PERCENT 36.4  10 - 50 %L   MID (cbc) 0.6  0 - 0.9   POC MID % 6.4  0 - 12 %M   POC Granulocyte 5.6  2 - 6.9   Granulocyte percent 57.2  37 - 80 %G   RBC 4.34  4.04 - 5.48 M/uL   Hemoglobin 12.3  12.2 - 16.2 g/dL   HCT, POC 38.5  37.7 - 47.9 %   MCV 88.7  80 - 97 fL   MCH, POC 28.3  27 - 31.2 pg   MCHC 31.9  31.8 - 35.4 g/dL   RDW, POC 14.5     Platelet Count, POC 245  142 - 424 K/uL   MPV 9.0  0 - 99.8 fL     [facility] reading (PRIMARY) by  Dr. Brigitte Pulse. EKG was normal sinus rhythm with no ischemic changes.   Goal peak flow is 470. Peak flow in office is 500.  Assessment & Plan:   Chest pain - Plan: EKG 12-Lead, POCT CBC  SOB (shortness of breath) - Plan: EKG 12-Lead, POCT CBC - no signs of asthma on exam. If recurs, cons CXR or CTA.  GERD (gastroesophageal reflux disease) - start ppi, ok to augment w/ prn zantac or tums while ppi takes effect. F/u w/ Dr. Tobe Sos tomorrow at prev sched appt for recheck.  Pt is instructed to go to ER or call 911 if pain returns and is not relieved by antacids or has any asstd concerning  sxs. Cannot rule out PE but highly unlikely with normal EKG and no other s/s.  Meds ordered this encounter  Medications  . omeprazole (PRILOSEC) 40 MG capsule    Sig: Take 1 capsule (40 mg total) by mouth daily.    Dispense:  30 capsule    Refill:  3    I personally performed the services described in this documentation, which was scribed in my presence. The recorded information has been reviewed and considered, and addended by me  as needed.  Patricia Cheadle, MD MPH

## 2014-01-07 ENCOUNTER — Ambulatory Visit: Payer: No Typology Code available for payment source | Admitting: Family Medicine

## 2014-01-08 ENCOUNTER — Telehealth: Payer: Self-pay

## 2014-01-08 NOTE — Telephone Encounter (Signed)
Left message on machine to call back  

## 2014-01-08 NOTE — Telephone Encounter (Signed)
Message copied by Constance Goltz on Fri Jan 08, 2014  7:58 AM ------      Message from: Brigitte Pulse, EVA      Created: Thu Jan 07, 2014  3:56 PM       I saw the pt did not keep her appt w/ Dr. Leward Quan this morning.  Is she feeling better? Has she had any more CP or other sxs?      Brigitte Pulse ------

## 2014-01-11 NOTE — Telephone Encounter (Signed)
LMVM that we were calling to check on the patient.  Please call back.

## 2014-01-12 NOTE — Telephone Encounter (Signed)
LMVM to CB. 

## 2014-01-12 NOTE — Telephone Encounter (Signed)
Patient states she is doing better and is not having any more chest pain.  She has been taking her medicine and is having some heartburn but otherwise is fine.  Advised patient if sx return to give [place] a callback or RTC.  She said she would do so.

## 2014-06-14 ENCOUNTER — Encounter: Payer: Self-pay | Admitting: Family Medicine

## 2014-06-14 ENCOUNTER — Ambulatory Visit (INDEPENDENT_AMBULATORY_CARE_PROVIDER_SITE_OTHER): Payer: No Typology Code available for payment source | Admitting: Family Medicine

## 2014-06-14 ENCOUNTER — Ambulatory Visit: Payer: No Typology Code available for payment source

## 2014-06-14 VITALS — BP 120/79 | HR 62 | Temp 98.9°F | Resp 16 | Ht 61.0 in | Wt 153.4 lb

## 2014-06-14 DIAGNOSIS — Z3049 Encounter for surveillance of other contraceptives: Secondary | ICD-10-CM

## 2014-06-14 DIAGNOSIS — Z920 Personal history of contraception: Secondary | ICD-10-CM

## 2014-06-15 NOTE — Progress Notes (Signed)
   Subjective:    Patient ID: Patricia Barron, female    DOB: 18-Nov-1975, 38 y.o.   MRN: [id number]  HPI  Patient presents today for depo-provera injection. She is outside of her window. First day of LMP 4 days ago.    Review of Systems     Objective:   Physical Exam        Assessment & Plan:  1. Personal history of contraception - ok for patient to have Depo Provera injection -Patient not seen by provider for this visit.   Elby Beck, FNP-BC  [facility], [facility]  06/15/2014 8:19 AM

## 2014-07-05 ENCOUNTER — Encounter: Payer: Self-pay | Admitting: Family Medicine

## 2014-08-09 ENCOUNTER — Ambulatory Visit: Payer: No Typology Code available for payment source

## 2014-09-10 ENCOUNTER — Ambulatory Visit: Payer: No Typology Code available for payment source

## 2015-07-30 ENCOUNTER — Telehealth (HOSPITAL_COMMUNITY): Payer: Self-pay | Admitting: *Deleted

## 2015-07-30 ENCOUNTER — Encounter (HOSPITAL_COMMUNITY): Payer: Self-pay | Admitting: *Deleted

## 2015-07-30 ENCOUNTER — Emergency Department (INDEPENDENT_AMBULATORY_CARE_PROVIDER_SITE_OTHER)
Admission: EM | Admit: 2015-07-30 | Discharge: 2015-07-30 | Disposition: A | Discharge status: Home / Self Care | Payer: Commercial Managed Care - HMO | Source: Home / Self Care | Attending: Emergency Medicine | Admitting: Emergency Medicine

## 2015-07-30 DIAGNOSIS — J4521 Mild intermittent asthma with (acute) exacerbation: Secondary | ICD-10-CM

## 2015-07-30 DIAGNOSIS — R05 Cough: Secondary | ICD-10-CM | POA: Diagnosis not present

## 2015-07-30 DIAGNOSIS — R059 Cough, unspecified: Secondary | ICD-10-CM

## 2015-07-30 MED ORDER — ALBUTEROL SULFATE HFA 108 (90 BASE) MCG/ACT IN AERS: 1.0000 | INHALATION_SPRAY | Freq: Four times a day (QID) | RESPIRATORY_TRACT | Status: DC | PRN

## 2015-07-30 MED ORDER — PREDNISONE 50 MG PO TABS: ORAL_TABLET | ORAL | Status: DC

## 2015-07-30 MED ORDER — AEROCHAMBER PLUS MISC: Status: DC

## 2015-07-30 MED ORDER — MOMETASONE FUROATE 50 MCG/ACT NA SUSP: 2.0000 | Freq: Every day | NASAL | Status: DC

## 2015-07-30 MED ORDER — HYDROCOD POLST-CPM POLST ER 10-8 MG/5ML PO SUER: 5.0000 mL | Freq: Two times a day (BID) | ORAL | Status: DC | PRN

## 2015-07-30 NOTE — ED Notes (Signed)
Does not have any inhalers.

## 2015-07-30 NOTE — ED Notes (Signed)
Was treated on 11/21 at another urgent care for bronchitis - received "2 shots".  Had not had significant improvement.  Continues with "bad cough", coughing fits that end with emesis.  Has been taking Mucinex, Robitussin.  Denies fevers.

## 2015-07-30 NOTE — ED Provider Notes (Signed)
HPI  SUBJECTIVE:  Patricia Barron is a 39 y.o. female who presents with a cough for 2 weeks. She states initially started off as upper respiratory like symptoms which have now resolved. She states that she continues to have nasal congestion, that her cough is productive of whitish mucus. States that she can't sleep at night secondary to the cough. She reports chest soreness from coughing but denies any other chest pain. No nausea, but no vomiting. She reports a tickle in the back of her throat. She reports occasional posttussive emesis, and abdominal soreness from all the coughing.. No aggravating or alleviating factors. She has tried multiple over-the-counter cough and cold remedies. She was seen at another urgent care on Monday, thought to have bronchitis, given 2 unknown shots, which she states has not made her any better. She denies other vomiting, fevers, other chest pain, shortness of breath, wheezing. She denies any allergy symptoms or GERD. She has a past medical history of asthma. She is not a smoker., Acid reflux which she states is severe. She states that it is not bothering her currently. No diabetes, hypertension. Denies possibility of being pregnant she did not get her flu shot this year.  Past Medical History  Diagnosis Date  . Asthma     History reviewed. No pertinent past surgical history.  Family History  Problem Relation Age of Onset  . Diabetes Mother   . Hypertension Mother   . Hyperlipidemia Father   . Diabetes Maternal Grandmother   . Kidney failure Maternal Grandmother     Social History  Substance Use Topics  . Smoking status: Never Smoker   . Smokeless tobacco: None  . Alcohol Use: No    No current facility-administered medications for this encounter.  Current outpatient prescriptions:  .  albuterol (PROVENTIL HFA;VENTOLIN HFA) 108 (90 BASE) MCG/ACT inhaler, Inhale 1-2 puffs into the lungs every 6 (six) hours as needed for wheezing or shortness of breath.  Dispense with aerochamber, Disp: 1 Inhaler, Rfl: 0 .  chlorpheniramine-HYDROcodone (TUSSIONEX PENNKINETIC ER) 10-8 MG/5ML SUER, Take 5 mLs by mouth every 12 (twelve) hours as needed for cough., Disp: 120 mL, Rfl: 0 .  mometasone (NASONEX) 50 MCG/ACT nasal spray, Place 2 sprays into the nose daily., Disp: 17 g, Rfl: 0 .  omeprazole (PRILOSEC) 40 MG capsule, Take 1 capsule (40 mg total) by mouth daily., Disp: 30 capsule, Rfl: 3 .  predniSONE (DELTASONE) 50 MG tablet, 1 tablet po daily x 2 days, then 1/2 tablet once daily for 2 days, Disp: 5 tablet, Rfl: 0 .  Spacer/Aero-Holding Chambers (AEROCHAMBER PLUS) inhaler, Use as instructed, Disp: 1 each, Rfl: 2  No Known Allergies   ROS  As noted in HPI.   Physical Exam  BP 130/79 mmHg  Pulse 78  Temp(Src) 98.3 F (36.8 C) (Oral)  Resp 20  SpO2 97%  LMP 07/27/2015 (Exact Date)  Constitutional: Well developed, well nourished, no acute distress Eyes:  EOMI, conjunctiva normal bilaterally HENT: Normocephalic, atraumatic,mucus membranes moist positive nasal congestion, erythematous, swollen turbinates. No sinus tenderness. Normal oropharynx. Positive cobblestoning. No postnasal drip noted. Respiratory: Normal inspiratory effort good air movement occasional wheezing that clears with coughing no rales, rhonchi. Cardiovascular: Normal rate regular rhythm no murmurs, rubs, gallops GI: nondistended skin: No rash, skin intact Musculoskeletal: no deformities Neurologic: Alert & oriented x 3, no focal neuro deficits Psychiatric: Speech and behavior appropriate   ED Course   Medications - No data to display  No orders of the defined types  were placed in this encounter.    No results found for this or any previous visit (from the past 24 hour(s)). No results found.  ED Clinical Impression  Cough  Asthma, mild intermittent, with acute exacerbation  ED Assessment/Plan  Feel that this is postviral cough with a component of an asthma  exacerbation with this. Seriously doubt pneumonia given absence of fevers and absence of focal lung findings. She does not have any inhalers at home. Home with nasal steroids, saline nasal irrigation, Mucinex D, cough syrup, albuterol spacer, 5 days of prednisone. Follow-up with primary care physician as needed return here if gets worse discussed medical decision-making, signs and symptoms that should prompt return to the department. Patient agrees with plan  *This clinic note was created using Dragon dictation software. Therefore, there may be occasional mistakes despite careful proofreading.  ?   Melynda Ripple, MD 07/30/15 [phone]

## 2015-07-30 NOTE — Discharge Instructions (Signed)
[facility] [address] [place], [place]  [address] [phone]

## 2015-07-30 NOTE — ED Notes (Signed)
Per Dr. Darleene Cleaver request: change Prednisone Rx instructions to 50mg , 1 PO qd x 5 days, #5.  Notified pt of need for [place] to change Rx.  Pt had not picked up Rx yet.  Notified pharmacist at [facility]) per pt request.  Pharmacist verbalized understanding.

## 2015-08-09 ENCOUNTER — Other Ambulatory Visit: Payer: Self-pay | Admitting: Internal Medicine

## 2015-08-09 DIAGNOSIS — Z1231 Encounter for screening mammogram for malignant neoplasm of breast: Secondary | ICD-10-CM

## 2015-09-02 ENCOUNTER — Ambulatory Visit: Payer: Commercial Managed Care - HMO

## 2015-09-15 ENCOUNTER — Ambulatory Visit: Payer: Commercial Managed Care - HMO

## 2015-11-22 ENCOUNTER — Emergency Department (INDEPENDENT_AMBULATORY_CARE_PROVIDER_SITE_OTHER)
Admission: EM | Admit: 2015-11-22 | Discharge: 2015-11-22 | Disposition: A | Discharge status: Home / Self Care | Payer: Commercial Managed Care - HMO | Source: Home / Self Care | Attending: Family Medicine | Admitting: Family Medicine

## 2015-11-22 ENCOUNTER — Encounter (HOSPITAL_COMMUNITY): Payer: Self-pay | Admitting: Emergency Medicine

## 2015-11-22 DIAGNOSIS — N390 Urinary tract infection, site not specified: Secondary | ICD-10-CM

## 2015-11-22 LAB — POCT URINALYSIS DIP (DEVICE)
BILIRUBIN URINE: NEGATIVE
Glucose, UA: NEGATIVE mg/dL
KETONES UR: NEGATIVE mg/dL
Leukocytes, UA: NEGATIVE
Nitrite: POSITIVE — AB
PH: 6 (ref 5.0–8.0)
PROTEIN: NEGATIVE mg/dL
SPECIFIC GRAVITY, URINE: 1.015 (ref 1.005–1.030)
Urobilinogen, UA: 0.2 mg/dL (ref 0.0–1.0)

## 2015-11-22 LAB — POCT PREGNANCY, URINE: Preg Test, Ur: NEGATIVE

## 2015-11-22 MED ORDER — CEPHALEXIN 250 MG PO CAPS: 250.0000 mg | ORAL_CAPSULE | Freq: Three times a day (TID) | ORAL | Status: DC

## 2015-11-22 NOTE — ED Provider Notes (Signed)
CSN: [id number]     Arrival date & time 11/22/15  1510 History   First MD Initiated Contact with Patient 11/22/15 1736     Chief Complaint  Patient presents with  . Abdominal Pain  . Urinary Frequency  . Vaginal Discharge   (Consider location/radiation/quality/duration/timing/severity/associated sxs/prior Treatment) HPI History obtained from patient:  Pt presents with the cc [id number] Duration of symptoms:1 week Treatment prior to arrival: none Context: sudden onset Other symptoms include: vaginal itching Pain score: 2  Past Medical History  Diagnosis Date  . Asthma    History reviewed. No pertinent past surgical history. Family History  Problem Relation Age of Onset  . Diabetes Mother   . Hypertension Mother   . Hyperlipidemia Father   . Diabetes Maternal Grandmother   . Kidney failure Maternal Grandmother    Social History  Substance Use Topics  . Smoking status: Never Smoker   . Smokeless tobacco: None  . Alcohol Use: No   OB History    Gravida Para Term Preterm AB TAB SAB Ectopic Multiple Living   5 4 4       4      Review of Systems dysuria Allergies  Review of patient's allergies indicates no known allergies.  Home Medications   Prior to Admission medications   Medication Sig Start Date End Date Taking? Authorizing Provider  albuterol (PROVENTIL HFA;VENTOLIN HFA) 108 (90 BASE) MCG/ACT inhaler Inhale 1-2 puffs into the lungs every 6 (six) hours as needed for wheezing or shortness of breath. Dispense with aerochamber 07/30/15  Yes Melynda Ripple, MD  cephALEXin (KEFLEX) 250 MG capsule Take 1 capsule (250 mg total) by mouth 3 (three) times daily. 11/22/15   Konrad Felix, PA  chlorpheniramine-HYDROcodone [facility] ER) 10-8 MG/5ML SUER Take 5 mLs by mouth every 12 (twelve) hours as needed for cough. 07/30/15   Melynda Ripple, MD  mometasone (NASONEX) 50 MCG/ACT nasal spray Place 2 sprays into the nose daily. 07/30/15   Melynda Ripple, MD   omeprazole (PRILOSEC) 40 MG capsule Take 1 capsule (40 mg total) by mouth daily. 01/06/14   Shawnee Knapp, MD  predniSONE (DELTASONE) 50 MG tablet 1 tablet po daily x 2 days, then 1/2 tablet once daily for 2 days 07/30/15   Melynda Ripple, MD  Spacer/Aero-Holding Chambers (AEROCHAMBER PLUS) inhaler Use as instructed 07/30/15   Melynda Ripple, MD   Meds Ordered and Administered this Visit  Medications - No data to display  BP 128/79 mmHg  Pulse 61  Temp(Src) 98.3 F (36.8 C) (Oral)  Resp 18  SpO2 100%  LMP 10/27/2015 (Exact Date) No data found.   Physical Exam NURSES NOTES AND VITAL SIGNS REVIEWED. CONSTITUTIONAL: Well developed, well nourished, no acute distress HEENT: normocephalic, atraumatic, right and left TM's are normal EYES: Conjunctiva normal NECK:normal ROM, supple, no adenopathy PULMONARY:No respiratory distress, normal effort, Lungs: CTAb/l, no wheezes, or increased work of breathing CARDIOVASCULAR: RRR, no murmur ABDOMEN: soft, ND, NT, +'ve BS, no CVA-T MUSCULOSKELETAL: Normal ROM of all extremities,  SKIN: warm and dry without rash PSYCHIATRIC: Mood and affect, behavior are normal  ED Course  Procedures (including critical care time)  Labs Review Labs Reviewed  POCT URINALYSIS DIP (DEVICE) - Abnormal; Notable for the following:    Hgb urine dipstick TRACE (*)    Nitrite POSITIVE (*)    All other components within normal limits  POCT PREGNANCY, URINE    Imaging Review No results found.   Visual Acuity Review  Right Eye Distance:  Left Eye Distance:   Bilateral Distance:    Right Eye Near:   Left Eye Near:    Bilateral Near:         MDM   1. UTI (lower urinary tract infection)     Patient is reassured that there are no issues that require transfer to higher level of care at this time or additional tests. Patient is advised to continue home symptomatic treatment. Patient is advised that if there are new or worsening symptoms to attend the  emergency department, contact primary care provider, or return to UC. Instructions of care provided discharged home in stable condition. Return to work/school note provided.   THIS NOTE WAS GENERATED USING A VOICE RECOGNITION SOFTWARE PROGRAM. ALL REASONABLE EFFORTS  WERE MADE TO PROOFREAD THIS DOCUMENT FOR ACCURACY.  I have verbally reviewed the discharge instructions with the patient. A printed AVS was given to the patient.  All questions were answered prior to discharge.      Konrad Felix, [place] 11/22/15 1810

## 2015-11-22 NOTE — ED Notes (Addendum)
The patient presented to the [facility] with a complaint of lower left sided abdominal pain for 5 days. The patient also reported urinary frequency and vaginal discharge.

## 2015-11-22 NOTE — Discharge Instructions (Signed)
Asymptomatic Bacteriuria, Female Asymptomatic bacteriuria is the presence of a large number of bacteria in your urine without the usual symptoms of burning or frequent urination. The following conditions increase the risk of asymptomatic bacteriuria:  Diabetes mellitus.  Advanced age.  Pregnancy in the first trimester.  Kidney stones.  Kidney transplants.  Leaky kidney tube valve in young children (reflux). Treatment for this condition is not needed in most people and can lead to other problems such as too much yeast and growth of resistant bacteria. However, some people, such as pregnant women, do need treatment to prevent kidney infection. Asymptomatic bacteriuria in pregnancy is also associated with fetal growth restriction, premature labor, and newborn death. HOME CARE INSTRUCTIONS Monitor your condition for any changes. The following actions may help to relieve any discomfort you are feeling:  Drink enough water and fluids to keep your urine clear or pale yellow. Go to the bathroom more often to keep your bladder empty.  Keep the area around your vagina and rectum clean. Wipe yourself from front to back after urinating. SEEK IMMEDIATE MEDICAL CARE IF:  You develop signs of an infection such as:  Burning with urination.  Frequency of voiding.  Back pain.  Fever.  You have blood in the urine.  You develop a fever. MAKE SURE YOU:  Understand these instructions.  Will watch your condition.  Will get help right away if you are not doing well or get worse.   This information is not intended to replace advice given to you by your health care provider. Make sure you discuss any questions you have with your health care provider.   Document Released: 08/20/2005 Document Revised: 09/10/2014 Document Reviewed: 02/09/2013 Elsevier Interactive Patient Education 2016 Elsevier Inc. Urinary Tract Infection Urinary tract infections (UTIs) can develop anywhere along your urinary  tract. Your urinary tract is your body's drainage system for removing wastes and extra water. Your urinary tract includes two kidneys, two ureters, a bladder, and a urethra. Your kidneys are a pair of bean-shaped organs. Each kidney is about the size of your fist. They are located below your ribs, one on each side of your spine. CAUSES Infections are caused by microbes, which are microscopic organisms, including fungi, viruses, and bacteria. These organisms are so small that they can only be seen through a microscope. Bacteria are the microbes that most commonly cause UTIs. SYMPTOMS  Symptoms of UTIs may vary by age and gender of the patient and by the location of the infection. Symptoms in young women typically include a frequent and intense urge to urinate and a painful, burning feeling in the bladder or urethra during urination. Older women and men are more likely to be tired, shaky, and weak and have muscle aches and abdominal pain. A fever may mean the infection is in your kidneys. Other symptoms of a kidney infection include pain in your back or sides below the ribs, nausea, and vomiting. DIAGNOSIS To diagnose a UTI, your caregiver will ask you about your symptoms. Your caregiver will also ask you to provide a urine sample. The urine sample will be tested for bacteria and white blood cells. White blood cells are made by your body to help fight infection. TREATMENT  Typically, UTIs can be treated with medication. Because most UTIs are caused by a bacterial infection, they usually can be treated with the use of antibiotics. The choice of antibiotic and length of treatment depend on your symptoms and the type of bacteria causing your infection. HOME CARE  INSTRUCTIONS  If you were prescribed antibiotics, take them exactly as your caregiver instructs you. Finish the medication even if you feel better after you have only taken some of the medication.  Drink enough water and fluids to keep your urine  clear or pale yellow.  Avoid caffeine, tea, and carbonated beverages. They tend to irritate your bladder.  Empty your bladder often. Avoid holding urine for long periods of time.  Empty your bladder before and after sexual intercourse.  After a bowel movement, women should cleanse from front to back. Use each tissue only once. SEEK MEDICAL CARE IF:   You have back pain.  You develop a fever.  Your symptoms do not begin to resolve within 3 days. SEEK IMMEDIATE MEDICAL CARE IF:   You have severe back pain or lower abdominal pain.  You develop chills.  You have nausea or vomiting.  You have continued burning or discomfort with urination. MAKE SURE YOU:   Understand these instructions.  Will watch your condition.  Will get help right away if you are not doing well or get worse.   This information is not intended to replace advice given to you by your health care provider. Make sure you discuss any questions you have with your health care provider.   Document Released: 05/30/2005 Document Revised: 05/11/2015 Document Reviewed: 09/28/2011 Elsevier Interactive Patient Education [employer].

## 2016-04-28 ENCOUNTER — Encounter (HOSPITAL_COMMUNITY): Payer: Self-pay | Admitting: Emergency Medicine

## 2016-04-28 ENCOUNTER — Ambulatory Visit (HOSPITAL_COMMUNITY)
Admission: EM | Admit: 2016-04-28 | Discharge: 2016-04-28 | Disposition: A | Discharge status: Home / Self Care | Payer: Commercial Managed Care - HMO | Attending: Emergency Medicine | Admitting: Emergency Medicine

## 2016-04-28 DIAGNOSIS — M5410 Radiculopathy, site unspecified: Secondary | ICD-10-CM

## 2016-04-28 DIAGNOSIS — M7710 Lateral epicondylitis, unspecified elbow: Secondary | ICD-10-CM

## 2016-04-28 DIAGNOSIS — S338XXA Sprain of other parts of lumbar spine and pelvis, initial encounter: Secondary | ICD-10-CM | POA: Diagnosis not present

## 2016-04-28 DIAGNOSIS — S39012A Strain of muscle, fascia and tendon of lower back, initial encounter: Secondary | ICD-10-CM

## 2016-04-28 MED ORDER — NAPROXEN 375 MG PO TABS: 375.0000 mg | ORAL_TABLET | Freq: Two times a day (BID) | ORAL | 0 refills | Status: DC

## 2016-04-28 MED ORDER — DICLOFENAC SODIUM 1 % TD GEL
1.0000 | Freq: Four times a day (QID) | TRANSDERMAL | 0 refills | Status: DC
Start: 2016-04-28 — End: 2017-06-07

## 2016-04-28 MED ORDER — TRAMADOL HCL 50 MG PO TABS: 50.0000 mg | ORAL_TABLET | Freq: Four times a day (QID) | ORAL | 0 refills | Status: DC | PRN

## 2016-04-28 MED ORDER — PREDNISONE 20 MG PO TABS: ORAL_TABLET | ORAL | 0 refills | Status: DC

## 2016-04-28 NOTE — ED Notes (Signed)
Patient undressed and provided warm blankets

## 2016-04-28 NOTE — ED Provider Notes (Signed)
CSN: [id number]     Arrival date & time 04/28/16  1205 History   First MD Initiated Contact with Patient 04/28/16 1347     Chief Complaint  Patient presents with  . 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 This pain is worse when she has to perform her duties of bending over pulling and lifting. Occasionally she will have pain to the right lower buttock described as a sharp pain radiating down the leg. This tends to come and go and last for very brief period of time. Denies pain to the mid back.  Second complaint is that of pain to the proximal forearms extensor surface and over the elbows. Worse with working and after work at home. There are no reported injuries or trauma to the back or arms. Occasionally she will have numbness in the forearms after working during the day.      Past Medical History:  Diagnosis Date  . Asthma    History reviewed. No pertinent surgical history. Family History  Problem Relation Age of Onset  . Diabetes Mother   . Hypertension Mother   . Hyperlipidemia Father   . Diabetes Maternal Grandmother   . Kidney failure Maternal Grandmother    Social History  Substance Use Topics  . Smoking status: Never Smoker  . Smokeless tobacco: Never Used  . Alcohol use No   OB History    Gravida Para Term Preterm AB Living   5 4 4     4    SAB TAB Ectopic Multiple Live Births                 Review of Systems  Constitutional: Negative for activity change, chills and fever.  HENT: Negative.   Respiratory: Negative.   Cardiovascular: Negative.   Musculoskeletal: Positive for arthralgias, back pain and myalgias. Negative for gait problem, joint swelling, neck pain and neck stiffness.       As per HPI  Skin: Negative for color change, pallor and rash.   Neurological: Negative.   Hematological: Negative.   All other systems reviewed and are negative.   Allergies  Review of patient's allergies indicates no known allergies.  Home Medications   Prior to Admission medications   Medication Sig Start Date End Date Taking? Authorizing Provider  albuterol (PROVENTIL HFA;VENTOLIN HFA) 108 (90 BASE) MCG/ACT inhaler Inhale 1-2 puffs into the lungs every 6 (six) hours as needed for wheezing or shortness of breath. Dispense with aerochamber 07/30/15   Melynda Ripple, MD  diclofenac sodium (VOLTAREN) 1 % GEL Apply 1 application topically 4 (four) times daily. 04/28/16   Janne Napoleon, NP  naproxen (NAPROSYN) 375 MG tablet Take 1 tablet (375 mg total) by mouth 2 (two) times daily. 04/28/16   Janne Napoleon, NP  predniSONE (DELTASONE) 20 MG tablet 3 Tabs PO Days 1-3, then 2 tabs PO Days 4-6, then 1 tab PO Day 7-9, then Half Tab PO Day 10-12 04/28/16   Janne Napoleon, NP  Spacer/Aero-Holding Chambers (AEROCHAMBER PLUS) inhaler Use as instructed 07/30/15   Melynda Ripple, MD  traMADol (ULTRAM) 50 MG tablet Take 1 tablet (50 mg total) by mouth every 6 (six) hours as needed. 04/28/16   Janne Napoleon, NP   Meds Ordered and Administered this Visit  Medications - No data to display  BP 118/70 (BP Location: Right Arm)   Pulse (!) 59  Temp 98.7 F (37.1 C) (Oral)   Resp 14   SpO2 100%  No data found.   Physical Exam  Constitutional: She is oriented to person, place, and time. She appears well-developed and well-nourished. No distress.  HENT:  Head: Normocephalic and atraumatic.  Eyes: EOM are normal. Pupils are equal, round, and reactive to light.  Neck: Normal range of motion. Neck supple.  Cardiovascular: Normal rate.   Pulmonary/Chest: Effort normal.  Musculoskeletal: Normal range of motion. She exhibits tenderness. She exhibits no edema or deformity.  There is tenderness to the lower paralumbar musculature. No tenderness, swelling, deformity or discoloration  to the spine. No tenderness to deep palpation of the mid buttock, lateral hip or thigh. Normal range of motion to the right hip.  Tenderness to the extensor musculature of the proximal forearm and mild tenderness over the the condyles bilaterally. Extension of the wrist produces the pain to the extensor muscles of the forearm and elbow. Distal neurovascular and motor sensory is grossly intact. No deformity or or discoloration.  Lymphadenopathy:    She has no cervical adenopathy.  Neurological: She is alert and oriented to person, place, and time. No cranial nerve deficit.  Skin: Skin is warm and dry. Capillary refill takes less than 2 seconds. No erythema.  Psychiatric: She has a normal mood and affect.  Nursing note and vitals reviewed.   Urgent Care Course   Clinical Course    Procedures (including critical care time)  Labs Review Labs Reviewed - No data to display  Imaging Review No results found.   Visual Acuity Review  Right Eye Distance:   Left Eye Distance:   Bilateral Distance:    Right Eye Near:   Left Eye Near:    Bilateral Near:         MDM   1. Lumbosacral strain, initial encounter   2. Low back strain, initial encounter   3. Radicular pain of right lower extremity   4. Lateral epicondylitis, unspecified laterality    For muscle pain and strain perform stretches as demonstrated is frequently as possible before, during and after work. Try to limit the amount of bending over as much as you can and that is possible. Apply the diclofenac gel up to 4 times a day and then cover with the thermal care heat wrap for up to 8 hours. Do not start the anti-inflammatory medicine until after you finished the prednisone taper dose. Take this medicine with food. Return instructions to help with back pain prevention and muscle pain treatment. Ice to elbows and forearms, heat to back. Meds ordered this encounter  Medications  . predniSONE (DELTASONE) 20 MG tablet    Sig: 3  Tabs PO Days 1-3, then 2 tabs PO Days 4-6, then 1 tab PO Day 7-9, then Half Tab PO Day 10-12    Dispense:  20 tablet    Refill:  0    Order Specific Question:   Supervising Provider    Answer:   Sherlene Shams [id number]  . diclofenac sodium (VOLTAREN) 1 % GEL    Sig: Apply 1 application topically 4 (four) times daily.    Dispense:  100 g    Refill:  0    Order Specific Question:   Supervising Provider    Answer:   Sherlene Shams [id number]  . naproxen (NAPROSYN) 375 MG tablet    Sig: Take 1 tablet (375 mg total) by mouth 2 (two) times daily.    Dispense:  20 tablet  Refill:  0    Order Specific Question:   Supervising Provider    Answer:   Sherlene Shams [id number]  . traMADol (ULTRAM) 50 MG tablet    Sig: Take 1 tablet (50 mg total) by mouth every 6 (six) hours as needed.    Dispense:  15 tablet    Refill:  0    Order Specific Question:   Supervising Provider    Answer:   Sherlene Shams [id number]       Janne Napoleon, NP 04/28/16 1420

## 2016-04-28 NOTE — Discharge Instructions (Signed)
For muscle pain and strain perform stretches as demonstrated is frequently as possible before, during and after work. Try to limit the amount of bending over as much as you can and that is possible. Apply the diclofenac gel up to 4 times a day and then cover with the thermal care heat wrap for up to 8 hours. Do not start the anti-inflammatory medicine until after you finished the prednisone taper dose. Take this medicine with food. Return instructions to help with back pain prevention and muscle pain treatment. Ice to elbows and forearms, heat to back.

## 2016-04-28 NOTE — ED Triage Notes (Signed)
Low back pain, sharp, shooting Right thigh with pain intermittently bilateral arms sore

## 2016-11-24 DIAGNOSIS — R05 Cough: Secondary | ICD-10-CM | POA: Diagnosis not present

## 2016-11-24 DIAGNOSIS — N390 Urinary tract infection, site not specified: Secondary | ICD-10-CM | POA: Diagnosis not present

## 2016-11-24 DIAGNOSIS — M545 Low back pain: Secondary | ICD-10-CM | POA: Diagnosis not present

## 2016-11-26 DIAGNOSIS — N390 Urinary tract infection, site not specified: Secondary | ICD-10-CM | POA: Diagnosis not present

## 2017-04-04 DIAGNOSIS — M545 Low back pain: Secondary | ICD-10-CM | POA: Diagnosis not present

## 2017-04-04 DIAGNOSIS — J45998 Other asthma: Secondary | ICD-10-CM | POA: Diagnosis not present

## 2017-04-26 DIAGNOSIS — M545 Low back pain: Secondary | ICD-10-CM | POA: Diagnosis not present

## 2017-06-03 ENCOUNTER — Encounter (HOSPITAL_COMMUNITY): Payer: Self-pay | Admitting: Family Medicine

## 2017-06-03 ENCOUNTER — Emergency Department (HOSPITAL_COMMUNITY)
Admission: EM | Admit: 2017-06-03 | Discharge: 2017-06-03 | Disposition: A | Discharge status: Home / Self Care | Payer: 59 | Attending: Emergency Medicine | Admitting: Emergency Medicine

## 2017-06-03 ENCOUNTER — Emergency Department (HOSPITAL_COMMUNITY): Payer: 59

## 2017-06-03 DIAGNOSIS — R109 Unspecified abdominal pain: Secondary | ICD-10-CM | POA: Diagnosis not present

## 2017-06-03 DIAGNOSIS — M545 Low back pain, unspecified: Secondary | ICD-10-CM

## 2017-06-03 DIAGNOSIS — S39011A Strain of muscle, fascia and tendon of abdomen, initial encounter: Secondary | ICD-10-CM | POA: Insufficient documentation

## 2017-06-03 DIAGNOSIS — M542 Cervicalgia: Secondary | ICD-10-CM | POA: Diagnosis not present

## 2017-06-03 DIAGNOSIS — Y999 Unspecified external cause status: Secondary | ICD-10-CM | POA: Insufficient documentation

## 2017-06-03 DIAGNOSIS — T148XXA Other injury of unspecified body region, initial encounter: Secondary | ICD-10-CM

## 2017-06-03 DIAGNOSIS — M79602 Pain in left arm: Secondary | ICD-10-CM | POA: Diagnosis not present

## 2017-06-03 DIAGNOSIS — Y9389 Activity, other specified: Secondary | ICD-10-CM | POA: Insufficient documentation

## 2017-06-03 DIAGNOSIS — R531 Weakness: Secondary | ICD-10-CM | POA: Diagnosis not present

## 2017-06-03 DIAGNOSIS — S3992XA Unspecified injury of lower back, initial encounter: Secondary | ICD-10-CM | POA: Diagnosis present

## 2017-06-03 DIAGNOSIS — S39012A Strain of muscle, fascia and tendon of lower back, initial encounter: Secondary | ICD-10-CM | POA: Diagnosis not present

## 2017-06-03 DIAGNOSIS — Y92410 Unspecified street and highway as the place of occurrence of the external cause: Secondary | ICD-10-CM | POA: Diagnosis not present

## 2017-06-03 LAB — URINALYSIS, ROUTINE W REFLEX MICROSCOPIC
Bilirubin Urine: NEGATIVE
Glucose, UA: NEGATIVE mg/dL
Hgb urine dipstick: NEGATIVE
KETONES UR: NEGATIVE mg/dL
Nitrite: POSITIVE — AB
PROTEIN: NEGATIVE mg/dL
Specific Gravity, Urine: 1.016 (ref 1.005–1.030)
pH: 8 (ref 5.0–8.0)

## 2017-06-03 LAB — POC URINE PREG, ED: PREG TEST UR: NEGATIVE

## 2017-06-03 MED ORDER — CYCLOBENZAPRINE HCL 10 MG PO TABS: 10.0000 mg | ORAL_TABLET | Freq: Two times a day (BID) | ORAL | 0 refills | Status: DC | PRN

## 2017-06-03 MED ORDER — SILVER SULFADIAZINE 1 % EX CREA
TOPICAL_CREAM | Freq: Once | CUTANEOUS | Status: AC
  Administered 2017-06-03: 1 via TOPICAL
  Filled 2017-06-03: qty 50

## 2017-06-03 MED ORDER — HYDROCODONE-ACETAMINOPHEN 5-325 MG PO TABS
1.0000 | ORAL_TABLET | Freq: Once | ORAL | Status: AC
  Administered 2017-06-03: 1 via ORAL
  Filled 2017-06-03: qty 1
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
## 2017-06-03 NOTE — ED Notes (Signed)
Discharge instructions reviewed with patient. Patient verbalizes understanding. VSS.   

## 2017-06-06 LAB — URINE CULTURE: Culture: >=100000 — AB

## 2017-06-07 ENCOUNTER — Encounter (HOSPITAL_COMMUNITY): Payer: Self-pay | Admitting: Emergency Medicine

## 2017-06-07 ENCOUNTER — Telehealth: Payer: Self-pay | Admitting: *Deleted

## 2017-06-07 ENCOUNTER — Emergency Department (HOSPITAL_COMMUNITY): Payer: 59

## 2017-06-07 ENCOUNTER — Emergency Department (HOSPITAL_COMMUNITY)
Admission: EM | Admit: 2017-06-07 | Discharge: 2017-06-07 | Disposition: A | Discharge status: Home / Self Care | Payer: 59 | Attending: Emergency Medicine | Admitting: Emergency Medicine

## 2017-06-07 DIAGNOSIS — M5412 Radiculopathy, cervical region: Secondary | ICD-10-CM | POA: Diagnosis not present

## 2017-06-07 DIAGNOSIS — S139XXA Sprain of joints and ligaments of unspecified parts of neck, initial encounter: Secondary | ICD-10-CM | POA: Diagnosis not present

## 2017-06-07 DIAGNOSIS — Y939 Activity, unspecified: Secondary | ICD-10-CM | POA: Insufficient documentation

## 2017-06-07 DIAGNOSIS — M62838 Other muscle spasm: Secondary | ICD-10-CM | POA: Diagnosis not present

## 2017-06-07 DIAGNOSIS — J45909 Unspecified asthma, uncomplicated: Secondary | ICD-10-CM | POA: Insufficient documentation

## 2017-06-07 DIAGNOSIS — R0789 Other chest pain: Secondary | ICD-10-CM | POA: Diagnosis not present

## 2017-06-07 DIAGNOSIS — Y92410 Unspecified street and highway as the place of occurrence of the external cause: Secondary | ICD-10-CM | POA: Diagnosis not present

## 2017-06-07 DIAGNOSIS — Y999 Unspecified external cause status: Secondary | ICD-10-CM | POA: Insufficient documentation

## 2017-06-07 DIAGNOSIS — R079 Chest pain, unspecified: Secondary | ICD-10-CM | POA: Diagnosis not present

## 2017-06-07 MED ORDER — NAPROXEN 375 MG PO TABS: 375.0000 mg | ORAL_TABLET | Freq: Two times a day (BID) | ORAL | 0 refills | Status: DC
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
## 2017-06-07 NOTE — Telephone Encounter (Signed)
Post ED Visit - Positive Culture Follow-up  Culture report reviewed by antimicrobial stewardship pharmacist:  []  Elenor Quinones, Pharm.D. []  Heide Guile, Pharm.D., BCPS AQ-ID []  Parks Neptune, Pharm.D., BCPS []  Alycia Rossetti, Pharm.D., BCPS []  [place], Pharm.D., BCPS, AAHIVP []  Legrand Como, Pharm.D., BCPS, AAHIVP []  Salome Arnt, PharmD, BCPS []  Dimitri Ped, PharmD, BCPS []  Vincenza Hews, PharmD, BCPS  Positive urine culture, reviewed by Benedetto Goad, PA-C No further patient follow-up is required at this time.  Harlon Flor Talley 06/07/2017, 10:14 AM

## 2017-07-04 DIAGNOSIS — M5414 Radiculopathy, thoracic region: Secondary | ICD-10-CM | POA: Diagnosis not present

## 2017-10-10 ENCOUNTER — Ambulatory Visit: Payer: 59 | Admitting: Family Medicine

## 2017-10-10 ENCOUNTER — Encounter: Payer: Self-pay | Admitting: Family Medicine

## 2017-10-10 VITALS — BP 90/60 | HR 78 | Temp 98.4°F | Ht 61.0 in | Wt 158.1 lb

## 2017-10-10 DIAGNOSIS — K219 Gastro-esophageal reflux disease without esophagitis: Secondary | ICD-10-CM | POA: Diagnosis not present

## 2017-10-10 DIAGNOSIS — Z0000 Encounter for general adult medical examination without abnormal findings: Secondary | ICD-10-CM | POA: Diagnosis not present

## 2017-10-10 DIAGNOSIS — Z13220 Encounter for screening for lipoid disorders: Secondary | ICD-10-CM

## 2017-10-10 DIAGNOSIS — Z1329 Encounter for screening for other suspected endocrine disorder: Secondary | ICD-10-CM | POA: Diagnosis not present

## 2017-10-10 MED ORDER — OMEPRAZOLE 20 MG PO CPDR: 20.0000 mg | DELAYED_RELEASE_CAPSULE | Freq: Every day | ORAL | 3 refills | Status: DC

## 2017-10-10 NOTE — Progress Notes (Signed)
Subjective:     Patricia Barron is a 42 y.o. female and is here for a comprehensive physical exam. The patient reports problems - GERD.  Pt states she was formerly seen at [facility].  Last Pap 2015.  Patient is unsure if she had HPV testing at this time.  Patient has never had a mammogram.  Pt endorses waking up early in the morning needing to belch to relieve the discomfort in her stomach and chest.  Patient states she also has the feeling of something being stuck in her esophagus for which she has to lift her arms above her head to relieve the feeling.  Patient notices symptoms with eating hot dogs, beef, pasta, and cheese.  Patient is not currently taking anything for GERD symptoms.  Patient will also noticed symptoms if she eats and then lays down shortly afterwards.  The patient endorses a history of asthma.  Currently controlled.  States symptoms have been every now and then.  Patient has a rescue albuterol inhaler for as needed use.  Patient states really hot weather, strong odors, smoke smoke alcohol symptoms.  Social History   Socioeconomic History  . Marital status: Single    Spouse name: Not on file  . Number of children: Not on file  . Years of education: Not on file  . Highest education level: Not on file  Social Needs  . Financial resource strain: Not on file  . Food insecurity - worry: Not on file  . Food insecurity - inability: Not on file  . Transportation needs - medical: Not on file  . Transportation needs - non-medical: Not on file  Occupational History  . Occupation: customer service  Tobacco Use  . Smoking status: Never Smoker  . Smokeless tobacco: Never Used  Substance and Sexual Activity  . Alcohol use: No  . Drug use: No  . Sexual activity: No  Other Topics Concern  . 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 HIV Screening  01/01/1990  . PAP SMEAR  09/10/2016  .  INFLUENZA VACCINE  06/12/2018 (Originally 04/03/2017)  . TETANUS/TDAP  09/11/2023    The following portions of the patient's history were reviewed and updated as appropriate: allergies, current medications, past family history, past medical history, past social history, past surgical history and problem list.  Review of Systems A comprehensive review of systems was negative.   With the exception of reflux symptoms.  Objective:    BP 90/60 (BP Location: Right Arm, Patient Position: Sitting, Cuff Size: Large)   Pulse 78   Temp 98.4 F (36.9 C) (Oral)   Ht 5\' 1"  (1.549 m)   Wt 158 lb 1.6 oz (71.7 kg)   LMP 09/19/2017   SpO2 98%   BMI 29.87 kg/m  General appearance: alert and cooperative Head: Normocephalic, without obvious abnormality, atraumatic Eyes: conjunctivae/corneas clear. PERRL, EOM's intact. Fundi benign. Ears: normal TM's and external ear canals both ears Nose: Nares normal. Septum midline. Mucosa normal. No drainage or sinus tenderness. Throat: lips, mucosa, and tongue normal; teeth and gums normal Neck: no adenopathy, no carotid bruit, no JVD, supple, symmetrical, trachea midline and thyroid not enlarged, symmetric, no tenderness/mass/nodules Lungs: clear to auscultation bilaterally Heart: regular rate and rhythm, S1, S2 normal, no murmur, click, rub or gallop Abdomen: soft, non-tender; bowel sounds normal; no masses,  no organomegaly Extremities: extremities  normal, atraumatic, no cyanosis or edema Skin: Skin color, texture, turgor normal. No rashes or lesions Lymph nodes: Cervical, supraclavicular, and axillary nodes normal. Neurologic: Alert and oriented X 3, normal strength and tone. Normal symmetric reflexes. Normal coordination and gait    Assessment:    Healthy female exam. With symptoms of GERD.     Plan:      Anticipatory guidance given including wearing seatbelts, smoke detectors in the home, increasing physical activity, increasing p.o. intake of water,  increasing p.o. intake of vegetables -Pap appears up-to-date.  Will obtain records to verify. -We will obtain labs including CBC, BMP, lipid panel.  Patient will likely return in the morning for these labs. -Offered influenza vaccine however patient declines at this time. -Given handout to schedule mammogram. See After Visit Summary for Counseling Recommendations    GERD -We will start omeprazole 20 mg daily and avoid foods that trigger symptoms. -Patient given handout. -We will reassess in the next few months.  Grier Mitts, MD

## 2017-10-10 NOTE — Patient Instructions (Addendum)
Preventive Care 40-64 Years, Female Preventive care refers to lifestyle choices and visits with your health care provider that can promote health and wellness. What does preventive care include?  A yearly physical exam. This is also called an annual well check.  Dental exams once or twice a year.  Routine eye exams. Ask your health care provider how often you should have your eyes checked.  Personal lifestyle choices, including: ? Daily care of your teeth and gums. ? Regular physical activity. ? Eating a healthy diet. ? Avoiding tobacco and drug use. ? Limiting alcohol use. ? Practicing safe sex. ? Taking low-dose aspirin daily starting at age 58. ? Taking vitamin and mineral supplements as recommended by your health care provider. What happens during an annual well check? The services and screenings done by your health care provider during your annual well check will depend on your age, overall health, lifestyle risk factors, and family history of disease. Counseling Your health care provider may ask you questions about your:  Alcohol use.  Tobacco use.  Drug use.  Emotional well-being.  Home and relationship well-being.  Sexual activity.  Eating habits.  Work and work Statistician.  Method of birth control.  Menstrual cycle.  Pregnancy history.  Screening You may have the following tests or measurements:  Height, weight, and BMI.  Blood pressure.  Lipid and cholesterol levels. These may be checked every 5 years, or more frequently if you are over 81 years old.  Skin check.  Lung cancer screening. You may have this screening every year starting at age 78 if you have a 30-pack-year history of smoking and currently smoke or have quit within the past 15 years.  Fecal occult blood test (FOBT) of the stool. You may have this test every year starting at age 65.  Flexible sigmoidoscopy or colonoscopy. You may have a sigmoidoscopy every 5 years or a colonoscopy  every 10 years starting at age 30.  Hepatitis C blood test.  Hepatitis B blood test.  Sexually transmitted disease (STD) testing.  Diabetes screening. This is done by checking your blood sugar (glucose) after you have not eaten for a while (fasting). You may have this done every 1-3 years.  Mammogram. This may be done every 1-2 years. Talk to your health care provider about when you should start having regular mammograms. This may depend on whether you have a family history of breast cancer.  BRCA-related cancer screening. This may be done if you have a family history of breast, ovarian, tubal, or peritoneal cancers.  Pelvic exam and Pap test. This may be done every 3 years starting at age 80. Starting at age 36, this may be done every 5 years if you have a Pap test in combination with an HPV test.  Bone density scan. This is done to screen for osteoporosis. You may have this scan if you are at high risk for osteoporosis.  Discuss your test results, treatment options, and if necessary, the need for more tests with your health care provider. Vaccines Your health care provider may recommend certain vaccines, such as:  Influenza vaccine. This is recommended every year.  Tetanus, diphtheria, and acellular pertussis (Tdap, Td) vaccine. You may need a Td booster every 10 years.  Varicella vaccine. You may need this if you have not been vaccinated.  Zoster vaccine. You may need this after age 5.  Measles, mumps, and rubella (MMR) vaccine. You may need at least one dose of MMR if you were born in  1958 or later. You may also need a second dose.  Pneumococcal 13-valent conjugate (PCV13) vaccine. You may need this if you have certain conditions and were not previously vaccinated.  Pneumococcal polysaccharide (PPSV23) vaccine. You may need one or two doses if you smoke cigarettes or if you have certain conditions.  Meningococcal vaccine. You may need this if you have certain  conditions.  Hepatitis A vaccine. You may need this if you have certain conditions or if you travel or work in places where you may be exposed to hepatitis A.  Hepatitis B vaccine. You may need this if you have certain conditions or if you travel or work in places where you may be exposed to hepatitis B.  Haemophilus influenzae type b (Hib) vaccine. You may need this if you have certain conditions.  Talk to your health care provider about which screenings and vaccines you need and how often you need them. This information is not intended to replace advice given to you by your health care provider. Make sure you discuss any questions you have with your health care provider. Document Released: 09/16/2015 Document Revised: 05/09/2016 Document Reviewed: 06/21/2015 Elsevier Interactive Patient Education  2018 [employer] for Gastroesophageal Reflux Disease, Adult When you have gastroesophageal reflux disease (GERD), the foods you eat and your eating habits are very important. Choosing the right foods can help ease your discomfort. What guidelines do I need to follow?  Choose fruits, vegetables, whole grains, and low-fat dairy products.  Choose low-fat meat, fish, and poultry.  Limit fats such as oils, salad dressings, butter, nuts, and avocado.  Keep a food diary. This helps you identify foods that cause symptoms.  Avoid foods that cause symptoms. These may be different for everyone.  Eat small meals often instead of 3 large meals a day.  Eat your meals slowly, in a place where you are relaxed.  Limit fried foods.  Cook foods using methods other than frying.  Avoid drinking alcohol.  Avoid drinking large amounts of liquids with your meals.  Avoid bending over or lying down until 2-3 hours after eating. What foods are not recommended? These are some foods and drinks that may make your symptoms worse: Vegetables Tomatoes. Tomato juice. Tomato and spaghetti sauce.  Chili peppers. Onion and garlic. Horseradish. Fruits Oranges, grapefruit, and lemon (fruit and juice). Meats High-fat meats, fish, and poultry. This includes hot dogs, ribs, ham, sausage, salami, and bacon. Dairy Whole milk and chocolate milk. Sour cream. Cream. Butter. Ice cream. Cream cheese. Drinks Coffee and tea. Bubbly (carbonated) drinks or energy drinks. Condiments Hot sauce. Barbecue sauce. Sweets/Desserts Chocolate and cocoa. Donuts. Peppermint and spearmint. Fats and Oils High-fat foods. This includes [place] fries and potato chips. Other Vinegar. Strong spices. This includes black pepper, white pepper, red pepper, cayenne, curry powder, cloves, ginger, and chili powder. The items listed above may not be a complete list of foods and drinks to avoid. Contact your dietitian for more information. This information is not intended to replace advice given to you by your health care provider. Make sure you discuss any questions you have with your health care provider. Document Released: 02/19/2012 Document Revised: 01/26/2016 Document Reviewed: 06/24/2013 Elsevier Interactive Patient Education  2017 [employer].  Gastroesophageal Reflux Disease, Adult Normally, food travels down the esophagus and stays in the stomach to be digested. If a person has gastroesophageal reflux disease (GERD), food and stomach acid move back up into the esophagus. When this happens, the esophagus becomes sore  and swollen (inflamed). Over time, GERD can make small holes (ulcers) in the lining of the esophagus. Follow these instructions at home: Diet  Follow a diet as told by your doctor. You may need to avoid foods and drinks such as: ? Coffee and tea (with or without caffeine). ? Drinks that contain alcohol. ? Energy drinks and sports drinks. ? Carbonated drinks or sodas. ? Chocolate and cocoa. ? Peppermint and mint flavorings. ? Garlic and onions. ? Horseradish. ? Spicy and acidic foods, such as  peppers, chili powder, curry powder, vinegar, hot sauces, and BBQ sauce. ? Citrus fruit juices and citrus fruits, such as oranges, lemons, and limes. ? Tomato-based foods, such as red sauce, chili, salsa, and pizza with red sauce. ? Fried and fatty foods, such as donuts, french fries, potato chips, and high-fat dressings. ? High-fat meats, such as hot dogs, rib eye steak, sausage, ham, and bacon. ? High-fat dairy items, such as whole milk, butter, and cream cheese.  Eat small meals often. Avoid eating large meals.  Avoid drinking large amounts of liquid with your meals.  Avoid eating meals during the 2-3 hours before bedtime.  Avoid lying down right after you eat.  Do not exercise right after you eat. General instructions  Pay attention to any changes in your symptoms.  Take over-the-counter and prescription medicines only as told by your doctor. Do not take aspirin, ibuprofen, or other NSAIDs unless your doctor says it is okay.  Do not use any tobacco products, including cigarettes, chewing tobacco, and e-cigarettes. If you need help quitting, ask your doctor.  Wear loose clothes. Do not wear anything tight around your waist.  Raise (elevate) the head of your bed about 6 inches (15 cm).  Try to lower your stress. If you need help doing this, ask your doctor.  If you are overweight, lose an amount of weight that is healthy for you. Ask your doctor about a safe weight loss goal.  Keep all follow-up visits as told by your doctor. This is important. Contact a doctor if:  You have new symptoms.  You lose weight and you do not know why it is happening.  You have trouble swallowing, or it hurts to swallow.  You have wheezing or a cough that keeps happening.  Your symptoms do not get better with treatment.  You have a hoarse voice. Get help right away if:  You have pain in your arms, neck, jaw, teeth, or back.  You feel sweaty, dizzy, or light-headed.  You have chest pain  or shortness of breath.  You throw up (vomit) and your throw up looks like blood or coffee grounds.  You pass out (faint).  Your poop (stool) is bloody or black.  You cannot swallow, drink, or eat. This information is not intended to replace advice given to you by your health care provider. Make sure you discuss any questions you have with your health care provider. Document Released: 02/06/2008 Document Revised: 01/26/2016 Document Reviewed: 12/15/2014 Elsevier Interactive Patient Education  [employer].

## 2017-10-11 ENCOUNTER — Other Ambulatory Visit: Payer: 59

## 2017-10-11 DIAGNOSIS — Z0000 Encounter for general adult medical examination without abnormal findings: Secondary | ICD-10-CM | POA: Diagnosis not present

## 2017-10-11 LAB — LIPID PANEL
CHOL/HDL RATIO: 4
Cholesterol: 156 mg/dL (ref 0–200)
HDL: 35.9 mg/dL — AB (ref >39.00)
LDL Cholesterol: 96 mg/dL (ref 0–99)
NonHDL: 120.03
Triglycerides: 119 mg/dL (ref 0.0–149.0)
VLDL: 23.8 mg/dL (ref 0.0–40.0)

## 2017-10-11 LAB — CBC WITH DIFFERENTIAL/PLATELET
BASOS PCT: 0.6 %
Basophils Absolute: 68 cells/uL (ref 0–200)
EOS ABS: 339 {cells}/uL (ref 15–500)
Eosinophils Relative: 3 %
HEMATOCRIT: 38.1 % (ref 35.0–45.0)
Hemoglobin: 12.7 g/dL (ref 11.7–15.5)
Lymphs Abs: 3051 cells/uL (ref 850–3900)
MCH: 28.6 pg (ref 27.0–33.0)
MCHC: 33.3 g/dL (ref 32.0–36.0)
MCV: 85.8 fL (ref 80.0–100.0)
MPV: 10.8 fL (ref 7.5–12.5)
Monocytes Relative: 8.4 %
NEUTROS PCT: 61 %
Neutro Abs: 6893 cells/uL (ref 1500–7800)
PLATELETS: 251 10*3/uL (ref 140–400)
RBC: 4.44 10*6/uL (ref 3.80–5.10)
RDW: 13 % (ref 11.0–15.0)
Total Lymphocyte: 27 %
WBC: 11.3 10*3/uL — ABNORMAL HIGH (ref 3.8–10.8)
WBCMIX: 949 {cells}/uL (ref 200–950)

## 2017-10-11 LAB — BASIC METABOLIC PANEL
BUN: 11 mg/dL (ref 6–23)
CHLORIDE: 105 meq/L (ref 96–112)
CO2: 25 meq/L (ref 19–32)
Calcium: 8.7 mg/dL (ref 8.4–10.5)
Creatinine, Ser: 0.85 mg/dL (ref 0.40–1.20)
GFR: 93.98 mL/min (ref >60.00)
GLUCOSE: 98 mg/dL (ref 70–99)
POTASSIUM: 3.9 meq/L (ref 3.5–5.1)
SODIUM: 136 meq/L (ref 135–145)

## 2017-10-11 LAB — T4, FREE: Free T4: 0.67 ng/dL (ref 0.60–1.60)

## 2017-10-11 LAB — TSH: TSH: 2.32 u[IU]/mL (ref 0.35–4.50)

## 2017-10-11 NOTE — Addendum Note (Signed)
Addended by: Elmer Picker on: 10/11/2017 08:02 AM   Modules accepted: Orders

## 2017-10-14 ENCOUNTER — Ambulatory Visit (HOSPITAL_COMMUNITY)
Admission: EM | Admit: 2017-10-14 | Discharge: 2017-10-14 | Disposition: A | Discharge status: Home / Self Care | Payer: 59 | Attending: Family Medicine | Admitting: Family Medicine

## 2017-10-14 ENCOUNTER — Other Ambulatory Visit: Payer: Self-pay

## 2017-10-14 ENCOUNTER — Encounter (HOSPITAL_COMMUNITY): Payer: Self-pay | Admitting: Emergency Medicine

## 2017-10-14 DIAGNOSIS — M791 Myalgia, unspecified site: Secondary | ICD-10-CM

## 2017-10-14 DIAGNOSIS — J069 Acute upper respiratory infection, unspecified: Secondary | ICD-10-CM | POA: Diagnosis not present

## 2017-10-14 MED ORDER — KETOROLAC TROMETHAMINE 60 MG/2ML IM SOLN
INTRAMUSCULAR | Status: AC
  Filled 2017-10-14: qty 2

## 2017-10-14 MED ORDER — KETOROLAC TROMETHAMINE 60 MG/2ML IM SOLN
60.0000 mg | Freq: Once | INTRAMUSCULAR | Status: AC
  Administered 2017-10-14: 60 mg via INTRAMUSCULAR

## 2017-10-14 MED ORDER — GUAIFENESIN ER 600 MG PO TB12: 1200.0000 mg | ORAL_TABLET | Freq: Two times a day (BID) | ORAL | 0 refills | Status: DC

## 2017-10-14 MED ORDER — FLUTICASONE PROPIONATE 50 MCG/ACT NA SUSP: 1.0000 | Freq: Every day | NASAL | 2 refills | Status: DC

## 2017-10-14 MED ORDER — IBUPROFEN 600 MG PO TABS: 600.0000 mg | ORAL_TABLET | Freq: Four times a day (QID) | ORAL | 0 refills | Status: DC | PRN

## 2017-10-14 NOTE — Discharge Instructions (Signed)
Push fluids to ensure adequate hydration and keep secretions thin.  Tylenol and/or ibuprofen as needed for pain or fevers.  Don't take additional ibuprofen for another 6 hours after the shot we have given you. Mucinex twice a day as an expectorant. Daily flonase to help with sinus symptoms. May try a sinus rinse to help with sinus symptoms. If symptoms worsen or do not improve in the next week to return to be seen or to follow up with your PCP.

## 2017-10-14 NOTE — ED Provider Notes (Signed)
[place]    CSN: [id number] Arrival date & time: 10/14/17  1014     History   Chief Complaint Chief Complaint  Patient presents with  . Cough    HPI Patricia Barron is a 42 y.o. female.   Hien presents with with complaints of body aches, chills, occasional cough, facial pressure behind eyes which started two days ago. Pain is 5/10. Symptoms have not worsened. Did have sore throat which has improved. Without runny nose or ear pain. Has been drinking ginger tea which has helped. Did not get a flu vaccine this season. Without gi/gu complaitns. No known ill contacts. Did have her annual exam with her PCP on 2/7. History of asthma, denies wheezing or shortness of breath.     ROS per HPI.       Past Medical History:  Diagnosis Date  . Asthma   . GERD (gastroesophageal reflux disease)     Patient Active Problem List   Diagnosis Date Noted  . Health care maintenance 11/10/2013  . Personal history of contraception 11/10/2013  . Asthma, mild intermittent 09/10/2013    History reviewed. No pertinent surgical history.  OB History    Gravida Para Term Preterm AB Living   5 4 4     4    SAB TAB Ectopic Multiple Live Births                   Home Medications    Prior to Admission medications   Medication Sig Start Date End Date Taking? Authorizing Provider  omeprazole (PRILOSEC) 20 MG capsule Take 1 capsule (20 mg total) by mouth daily. 10/10/17  Yes Billie Ruddy, MD  albuterol (PROVENTIL HFA;VENTOLIN HFA) 108 (90 BASE) MCG/ACT inhaler Inhale 1-2 puffs into the lungs every 6 (six) hours as needed for wheezing or shortness of breath. Dispense with aerochamber 07/30/15   Melynda Ripple, MD  fluticasone [facility]) 50 MCG/ACT nasal spray Place 1 spray into both nostrils daily. 10/14/17   Zigmund Gottron, NP  guaiFENesin (MUCINEX) 600 MG 12 hr tablet Take 2 tablets (1,200 mg total) by mouth 2 (two) times daily. 10/14/17   Zigmund Gottron, NP    ibuprofen (ADVIL,MOTRIN) 600 MG tablet Take 1 tablet (600 mg total) by mouth every 6 (six) hours as needed. 10/14/17   Zigmund Gottron, NP  Multiple Vitamins-Calcium (ONE-A-DAY WOMENS FORMULA PO) Take 1 tablet by mouth daily.    [provider]    Family History Family History  Problem Relation Age of Onset  . Diabetes Mother   . Hypertension Mother   . Hyperlipidemia Father   . Hypertension Father   . Diabetes Maternal Grandmother   . Kidney failure Maternal Grandmother     Social History Social History   Tobacco Use  . Smoking status: Never Smoker  . Smokeless tobacco: Never Used  Substance Use Topics  . Alcohol use: No  . Drug use: No     Allergies   Patient has no known allergies.   Review of Systems Review of Systems   Physical Exam Triage Vital Signs ED Triage Vitals  Enc Vitals Group     BP 10/14/17 1113 114/74     Pulse Rate 10/14/17 1113 60     Resp 10/14/17 1113 18     Temp 10/14/17 1113 98.6 F (37 C)     Temp src --      SpO2 10/14/17 1113 100 %     Weight --  Height --      Head Circumference --      Peak Flow --      Pain Score 10/14/17 1114 5     Pain Loc --      Pain Edu? --      Excl. in [place]? --    No data found.  Updated Vital Signs BP 114/74   Pulse 60   Temp 98.6 F (37 C)   Resp 18   LMP 09/19/2017   SpO2 100%   Visual Acuity Right Eye Distance:   Left Eye Distance:   Bilateral Distance:    Right Eye Near:   Left Eye Near:    Bilateral Near:     Physical Exam  Constitutional: She is oriented to person, place, and time. She appears well-developed and well-nourished. No distress.  HENT:  Head: Normocephalic and atraumatic.  Right Ear: Tympanic membrane, external ear and ear canal normal.  Left Ear: Tympanic membrane, external ear and ear canal normal.  Nose: Mucosal edema and rhinorrhea present. Right sinus exhibits no maxillary sinus tenderness and no frontal sinus tenderness. Left sinus exhibits no  maxillary sinus tenderness and no frontal sinus tenderness.  Mouth/Throat: Uvula is midline, oropharynx is clear and moist and mucous membranes are normal. No tonsillar exudate.  Eyes: Conjunctivae and EOM are normal. Pupils are equal, round, and reactive to light.  Cardiovascular: Normal rate, regular rhythm and normal heart sounds.  Pulmonary/Chest: Effort normal and breath sounds normal. No respiratory distress. She has no wheezes.  Lymphadenopathy:    She has cervical adenopathy.  Neurological: She is alert and oriented to person, place, and time.  Skin: Skin is warm and dry.     UC Treatments / Results  Labs (all labs ordered are listed, but only abnormal results are displayed) Labs Reviewed - No data to display  EKG  EKG Interpretation None       Radiology No results found.  Procedures Procedures (including critical care time)  Medications Ordered in UC Medications  ketorolac (TORADOL) injection 60 mg (not administered)     Initial Impression / Assessment and Plan / UC Course  I have reviewed the triage vital signs and the nursing notes.  Pertinent labs & imaging results that were available during my care of the patient were reviewed by me and considered in my medical decision making (see chart for details).     Benign physical findings. Afebrile. Without tachycardia, tachypnea or hypoxia. Lungs clear. Without respiratory distress. History and physical consistent with viral illness.  Supportive cares recommended. toradol given prior to departure. Return precautions provided. Patient verbalized understanding and agreeable to plan.    Final Clinical Impressions(s) / UC Diagnoses   Final diagnoses:  Upper respiratory tract infection, unspecified type    ED Discharge Orders        Ordered    fluticasone (FLONASE) 50 MCG/ACT nasal spray  Daily     10/14/17 1139    ibuprofen (ADVIL,MOTRIN) 600 MG tablet  Every 6 hours PRN     10/14/17 1139    guaiFENesin  (MUCINEX) 600 MG 12 hr tablet  2 times daily     10/14/17 1139       Controlled Substance Prescriptions [place] Controlled Substance Registry consulted? Not Applicable   Zigmund Gottron, NP 10/14/17 1146

## 2017-10-14 NOTE — ED Triage Notes (Signed)
Pt c/o chest congestion, body pains, chills, eye pain, cough x2 days.

## 2018-02-08 DIAGNOSIS — R51 Headache: Secondary | ICD-10-CM | POA: Diagnosis not present

## 2018-02-08 DIAGNOSIS — M791 Myalgia, unspecified site: Secondary | ICD-10-CM | POA: Diagnosis not present

## 2018-02-09 DIAGNOSIS — M791 Myalgia, unspecified site: Secondary | ICD-10-CM | POA: Diagnosis not present

## 2018-04-13 DIAGNOSIS — M25569 Pain in unspecified knee: Secondary | ICD-10-CM | POA: Diagnosis not present

## 2018-06-03 ENCOUNTER — Encounter (HOSPITAL_COMMUNITY): Payer: Self-pay | Admitting: Emergency Medicine

## 2018-06-03 ENCOUNTER — Other Ambulatory Visit: Payer: Self-pay

## 2018-06-03 ENCOUNTER — Ambulatory Visit (HOSPITAL_COMMUNITY)
Admission: EM | Admit: 2018-06-03 | Discharge: 2018-06-03 | Disposition: A | Discharge status: Home / Self Care | Payer: 59 | Attending: Family Medicine | Admitting: Family Medicine

## 2018-06-03 DIAGNOSIS — E860 Dehydration: Secondary | ICD-10-CM | POA: Diagnosis not present

## 2018-06-03 DIAGNOSIS — J4520 Mild intermittent asthma, uncomplicated: Secondary | ICD-10-CM | POA: Insufficient documentation

## 2018-06-03 DIAGNOSIS — K219 Gastro-esophageal reflux disease without esophagitis: Secondary | ICD-10-CM | POA: Diagnosis not present

## 2018-06-03 DIAGNOSIS — R52 Pain, unspecified: Secondary | ICD-10-CM | POA: Diagnosis present

## 2018-06-03 DIAGNOSIS — Z79899 Other long term (current) drug therapy: Secondary | ICD-10-CM | POA: Diagnosis not present

## 2018-06-03 DIAGNOSIS — M791 Myalgia, unspecified site: Secondary | ICD-10-CM | POA: Diagnosis not present

## 2018-06-03 DIAGNOSIS — R42 Dizziness and giddiness: Secondary | ICD-10-CM | POA: Diagnosis not present

## 2018-06-03 DIAGNOSIS — R55 Syncope and collapse: Secondary | ICD-10-CM | POA: Insufficient documentation

## 2018-06-03 DIAGNOSIS — Z791 Long term (current) use of non-steroidal anti-inflammatories (NSAID): Secondary | ICD-10-CM | POA: Diagnosis not present

## 2018-06-03 DIAGNOSIS — R109 Unspecified abdominal pain: Secondary | ICD-10-CM | POA: Diagnosis present

## 2018-06-03 DIAGNOSIS — R8281 Pyuria: Secondary | ICD-10-CM | POA: Diagnosis not present

## 2018-06-03 DIAGNOSIS — R1030 Lower abdominal pain, unspecified: Secondary | ICD-10-CM

## 2018-06-03 DIAGNOSIS — R102 Pelvic and perineal pain: Secondary | ICD-10-CM | POA: Diagnosis not present

## 2018-06-03 LAB — POCT I-STAT, CHEM 8
BUN: 6 mg/dL (ref 6–20)
CALCIUM ION: 1.14 mmol/L — AB (ref 1.15–1.40)
CHLORIDE: 104 mmol/L (ref 98–111)
Creatinine, Ser: 0.9 mg/dL (ref 0.44–1.00)
Glucose, Bld: 136 mg/dL — ABNORMAL HIGH (ref 70–99)
HEMATOCRIT: 38 % (ref 36.0–46.0)
Hemoglobin: 12.9 g/dL (ref 12.0–15.0)
POTASSIUM: 3 mmol/L — AB (ref 3.5–5.1)
Sodium: 140 mmol/L (ref 135–145)
TCO2: 25 mmol/L (ref 22–32)

## 2018-06-03 LAB — POCT URINALYSIS DIP (DEVICE)
Bilirubin Urine: NEGATIVE
GLUCOSE, UA: NEGATIVE mg/dL
Ketones, ur: 40 mg/dL — AB
NITRITE: POSITIVE — AB
Protein, ur: NEGATIVE mg/dL
Urobilinogen, UA: 0.2 mg/dL (ref 0.0–1.0)
pH: 5.5 (ref 5.0–8.0)

## 2018-06-03 MED ORDER — NITROFURANTOIN MONOHYD MACRO 100 MG PO CAPS: 100.0000 mg | ORAL_CAPSULE | Freq: Two times a day (BID) | ORAL | 0 refills | Status: DC

## 2018-06-03 NOTE — Discharge Instructions (Addendum)
The urine suggests that your problem may be a urinary tract infection.  The urine shows that you have mild dehydration and that you have inflammation that looks like an early urinary infection.  For that reason we are starting you on an antibiotic and encourage you to drink more fluid than you been drinking.  If you do not find that you are getting better over the next 48 hours, please return

## 2018-06-03 NOTE — ED Triage Notes (Signed)
Pt reports feeling lightheaded on Friday and almost passing out.  She states she felt the same way again today while at the grocery store.  Pt reports body aches, eye pain, and suprapubic pain that started two days ago.

## 2018-06-03 NOTE — ED Provider Notes (Signed)
[place]    CSN: [id number] Arrival date & time: 06/03/18  1739     History   Chief Complaint Chief Complaint  Patient presents with  . Generalized Body Aches  . Dizziness  . Abdominal Pain    HPI Patricia Barron is a 42 y.o. female.   Pt reports feeling lightheaded on Friday and almost passing out.  She states she felt the same way again today while at the grocery store.  Pt reports body aches, eye pain, and suprapubic pain that started two days ago.  The symptoms are related to 2 episodes so far.  Patient works at the [employer] and does very physical work.  Patient's last menstrual period was September 12-15.  She is not contraceptive does not believe she is pregnant.  Patient has polyuria but she believes this is because she drinks quite a bit.  She has no dysuria, fever, vomiting.  She does occasionally feel little nausea.  There is been no shortness of breath, chest pain, fever.     Past Medical History:  Diagnosis Date  . Asthma   . GERD (gastroesophageal reflux disease)     Patient Active Problem List   Diagnosis Date Noted  . Health care maintenance 11/10/2013  . Personal history of contraception 11/10/2013  . Asthma, mild intermittent 09/10/2013    History reviewed. No pertinent surgical history.  OB History    Gravida  5   Para  4   Term  4   Preterm      AB      Living  4     SAB      TAB      Ectopic      Multiple      Live Births               Home Medications    Prior to Admission medications   Medication Sig Start Date End Date Taking? Authorizing Provider  albuterol (PROVENTIL HFA;VENTOLIN HFA) 108 (90 BASE) MCG/ACT inhaler Inhale 1-2 puffs into the lungs every 6 (six) hours as needed for wheezing or shortness of breath. Dispense with aerochamber 07/30/15   Melynda Ripple, MD  fluticasone [facility]) 50 MCG/ACT nasal spray Place 1 spray into both nostrils daily. 10/14/17   Zigmund Gottron, NP    guaiFENesin (MUCINEX) 600 MG 12 hr tablet Take 2 tablets (1,200 mg total) by mouth 2 (two) times daily. 10/14/17   Zigmund Gottron, NP  ibuprofen (ADVIL,MOTRIN) 600 MG tablet Take 1 tablet (600 mg total) by mouth every 6 (six) hours as needed. 10/14/17   Zigmund Gottron, NP  Multiple Vitamins-Calcium (ONE-A-DAY WOMENS FORMULA PO) Take 1 tablet by mouth daily.    [provider]  nitrofurantoin, macrocrystal-monohydrate, (MACROBID) 100 MG capsule Take 1 capsule (100 mg total) by mouth 2 (two) times daily. 06/03/18   Robyn Haber, MD  omeprazole (PRILOSEC) 20 MG capsule Take 1 capsule (20 mg total) by mouth daily. 10/10/17   Billie Ruddy, MD    Family History Family History  Problem Relation Age of Onset  . Diabetes Mother   . Hypertension Mother   . Hyperlipidemia Father   . Hypertension Father   . Diabetes Maternal Grandmother   . Kidney failure Maternal Grandmother     Social History Social History   Tobacco Use  . Smoking status: Never Smoker  . Smokeless tobacco: Never Used  Substance Use Topics  . Alcohol use: No  . Drug use:  No     Allergies   Patient has no known allergies.   Review of Systems Review of Systems  HENT: Negative.   Respiratory: Negative.   Cardiovascular: Negative.   Gastrointestinal: Positive for nausea. Negative for vomiting.  Neurological: Positive for light-headedness and headaches.  All other systems reviewed and are negative.    Physical Exam Triage Vital Signs ED Triage Vitals [06/03/18 1816]  Enc Vitals Group     BP 123/67     Pulse Rate 78     Resp      Temp 98.8 F (37.1 C)     Temp Source Oral     SpO2 100 %     Weight      Height      Head Circumference      Peak Flow      Pain Score 5     Pain Loc      Pain Edu?      Excl. in [place]?    No data found.  Updated Vital Signs BP 117/70 (BP Location: Left Arm)   Pulse 84   Temp 98.8 F (37.1 C) (Oral)   LMP 05/15/2018 (Exact Date)   SpO2 100%     Physical Exam  Constitutional: She is oriented to person, place, and time. She appears well-developed and well-nourished.  HENT:  Head: Normocephalic and atraumatic.  Mouth/Throat: Oropharynx is clear and moist.  Eyes: Pupils are equal, round, and reactive to light. EOM are normal.  Cardiovascular: Normal rate.  Murmur heard. 2/6 systolic ejection type murmur best heard at the right sternal border  Pulmonary/Chest: Effort normal and breath sounds normal.  Abdominal: Soft. Normal appearance and bowel sounds are normal. There is no tenderness.  Neurological: She is alert and oriented to person, place, and time.  Skin: Skin is warm and dry.  Psychiatric: She has a normal mood and affect. Her behavior is normal.  Nursing note and vitals reviewed.    UC Treatments / Results  Labs (all labs ordered are listed, but only abnormal results are displayed) Labs Reviewed  POCT I-STAT, CHEM 8 - Abnormal; Notable for the following components:      Result Value   Potassium 3.0 (*)    Glucose, Bld 136 (*)    Calcium, Ion 1.14 (*)    All other components within normal limits  POCT URINALYSIS DIP (DEVICE) - Abnormal; Notable for the following components:   Ketones, ur 40 (*)    Hgb urine dipstick SMALL (*)    Nitrite POSITIVE (*)    Leukocytes, UA TRACE (*)    All other components within normal limits  URINE CULTURE    EKG None  Radiology No results found.  Procedures Procedures (including critical care time)  Medications Ordered in UC Medications - No data to display  Initial Impression / Assessment and Plan / UC Course  I have reviewed the triage vital signs and the nursing notes.  Pertinent labs & imaging results that were available during my care of the patient were reviewed by me and considered in my medical decision making (see chart for details).    Final Clinical Impressions(s) / UC Diagnoses   Final diagnoses:  Pyuria  Postural dizziness with presyncope   Dehydration     Discharge Instructions     The urine suggests that your problem may be a urinary tract infection.  The urine shows that you have mild dehydration and that you have inflammation that looks like an early urinary infection.  For that reason we are starting you on an antibiotic and encourage you to drink more fluid than you been drinking.  If you do not find that you are getting better over the next 48 hours, please return    ED Prescriptions    Medication Sig Dispense Auth. Provider   nitrofurantoin, macrocrystal-monohydrate, (MACROBID) 100 MG capsule Take 1 capsule (100 mg total) by mouth 2 (two) times daily. 20 capsule Robyn Haber, MD     Controlled Substance Prescriptions [place] Controlled Substance Registry consulted? Not Applicable   Robyn Haber, MD 06/03/18 [phone]

## 2018-06-05 DIAGNOSIS — Z3009 Encounter for other general counseling and advice on contraception: Secondary | ICD-10-CM | POA: Diagnosis not present

## 2018-06-06 LAB — URINE CULTURE: Culture: >=100000 — AB

## 2018-06-08 ENCOUNTER — Telehealth (HOSPITAL_COMMUNITY): Payer: Self-pay

## 2018-06-08 NOTE — Telephone Encounter (Signed)
Urine culture positive for E.coli. This was treated with Macrobid at ucc visit. Attempted to reach patient, no answer at this time.

## 2018-07-03 DIAGNOSIS — J029 Acute pharyngitis, unspecified: Secondary | ICD-10-CM | POA: Diagnosis not present

## 2018-07-03 DIAGNOSIS — J039 Acute tonsillitis, unspecified: Secondary | ICD-10-CM | POA: Diagnosis not present

## 2018-08-04 ENCOUNTER — Encounter (HOSPITAL_BASED_OUTPATIENT_CLINIC_OR_DEPARTMENT_OTHER): Payer: Self-pay | Admitting: Emergency Medicine

## 2018-08-04 ENCOUNTER — Emergency Department (HOSPITAL_BASED_OUTPATIENT_CLINIC_OR_DEPARTMENT_OTHER)
Admission: EM | Admit: 2018-08-04 | Discharge: 2018-08-04 | Disposition: A | Discharge status: Home / Self Care | Payer: 59 | Attending: Emergency Medicine | Admitting: Emergency Medicine

## 2018-08-04 ENCOUNTER — Other Ambulatory Visit: Payer: Self-pay

## 2018-08-04 DIAGNOSIS — J4521 Mild intermittent asthma with (acute) exacerbation: Secondary | ICD-10-CM | POA: Diagnosis not present

## 2018-08-04 DIAGNOSIS — Z79899 Other long term (current) drug therapy: Secondary | ICD-10-CM | POA: Insufficient documentation

## 2018-08-04 DIAGNOSIS — R0602 Shortness of breath: Secondary | ICD-10-CM | POA: Diagnosis not present

## 2018-08-04 LAB — CBC WITH DIFFERENTIAL/PLATELET
Abs Immature Granulocytes: 0.04 10*3/uL (ref 0.00–0.07)
BASOS PCT: 1 %
Basophils Absolute: 0.1 10*3/uL (ref 0.0–0.1)
Eosinophils Absolute: 0.2 10*3/uL (ref 0.0–0.5)
Eosinophils Relative: 2 %
HCT: 41.3 % (ref 36.0–46.0)
Hemoglobin: 12.9 g/dL (ref 12.0–15.0)
Immature Granulocytes: 0 %
Lymphocytes Relative: 42 %
Lymphs Abs: 5.8 10*3/uL — ABNORMAL HIGH (ref 0.7–4.0)
MCH: 27.5 pg (ref 26.0–34.0)
MCHC: 31.2 g/dL (ref 30.0–36.0)
MCV: 88.1 fL (ref 80.0–100.0)
MONOS PCT: 6 %
Monocytes Absolute: 0.8 10*3/uL (ref 0.1–1.0)
NEUTROS PCT: 49 %
Neutro Abs: 6.8 10*3/uL (ref 1.7–7.7)
Platelets: 265 10*3/uL (ref 150–400)
RBC: 4.69 MIL/uL (ref 3.87–5.11)
RDW: 14 % (ref 11.5–15.5)
WBC: 13.8 10*3/uL — ABNORMAL HIGH (ref 4.0–10.5)
nRBC: 0 % (ref 0.0–0.2)

## 2018-08-04 LAB — BASIC METABOLIC PANEL
Anion gap: 8 (ref 5–15)
BUN: 8 mg/dL (ref 6–20)
CO2: 23 mmol/L (ref 22–32)
Calcium: 8.7 mg/dL — ABNORMAL LOW (ref 8.9–10.3)
Chloride: 106 mmol/L (ref 98–111)
Creatinine, Ser: 0.97 mg/dL (ref 0.44–1.00)
GFR calc Af Amer: >60 mL/min (ref >60)
Glucose, Bld: 123 mg/dL — ABNORMAL HIGH (ref 70–99)
Potassium: 3.1 mmol/L — ABNORMAL LOW (ref 3.5–5.1)
Sodium: 137 mmol/L (ref 135–145)

## 2018-08-04 LAB — RAPID URINE DRUG SCREEN, HOSP PERFORMED
Amphetamines: NOT DETECTED
Barbiturates: NOT DETECTED
Benzodiazepines: NOT DETECTED
Cocaine: NOT DETECTED
Opiates: NOT DETECTED
Tetrahydrocannabinol: NOT DETECTED

## 2018-08-04 LAB — PREGNANCY, URINE: PREG TEST UR: NEGATIVE

## 2018-08-04 MED ORDER — DEXAMETHASONE SODIUM PHOSPHATE 10 MG/ML IJ SOLN
10.0000 mg | Freq: Once | INTRAMUSCULAR | Status: AC
  Administered 2018-08-04: 10 mg via INTRAVENOUS
  Filled 2018-08-04: qty 1

## 2018-08-04 MED ORDER — ALBUTEROL SULFATE (2.5 MG/3ML) 0.083% IN NEBU
INHALATION_SOLUTION | RESPIRATORY_TRACT | Status: AC
  Administered 2018-08-04: 2.5 mg
  Filled 2018-08-04: qty 3

## 2018-08-04 MED ORDER — IPRATROPIUM-ALBUTEROL 0.5-2.5 (3) MG/3ML IN SOLN
RESPIRATORY_TRACT | Status: AC
  Administered 2018-08-04: 3 mL
  Filled 2018-08-04: qty 3

## 2018-08-04 MED ORDER — ALBUTEROL SULFATE HFA 108 (90 BASE) MCG/ACT IN AERS: 2.0000 | INHALATION_SPRAY | RESPIRATORY_TRACT | 1 refills | Status: DC | PRN

## 2018-08-04 MED ORDER — SODIUM CHLORIDE 0.9 % IV BOLUS
1000.0000 mL | Freq: Once | INTRAVENOUS | Status: AC
  Administered 2018-08-04: 1000 mL via INTRAVENOUS

## 2018-08-04 MED ORDER — ALBUTEROL SULFATE HFA 108 (90 BASE) MCG/ACT IN AERS
2.0000 | INHALATION_SPRAY | Freq: Once | RESPIRATORY_TRACT | Status: AC
  Administered 2018-08-04: 2 via RESPIRATORY_TRACT
  Filled 2018-08-04: qty 6.7

## 2018-08-04 MED ORDER — POTASSIUM CHLORIDE CRYS ER 20 MEQ PO TBCR
40.0000 meq | EXTENDED_RELEASE_TABLET | Freq: Once | ORAL | Status: AC
  Administered 2018-08-04: 40 meq via ORAL
  Filled 2018-08-04: qty 2

## 2018-08-04 NOTE — Discharge Instructions (Signed)
Use the albuterol inhaler 2 puffs every 4 hours, as needed, for shortness of breath and/or wheezing.  Follow-up with your primary care provider, as planned.  Return to the ED for worsening symptoms.

## 2018-08-04 NOTE — ED Triage Notes (Signed)
SOB, wheezing and coughing since 1645. Does not have any home asthma meds any longer.

## 2018-08-04 NOTE — ED Notes (Signed)
ED Provider at bedside. 

## 2018-08-04 NOTE — ED Provider Notes (Signed)
[facility] EMERGENCY DEPARTMENT Provider Note   CSN: [id number] Arrival date & time: 08/04/18  1726     History   Chief Complaint Chief Complaint  Patient presents with  . Shortness of Breath    HPI Patricia Barron is a 42 y.o. female.  HPI  Patricia Barron is a 42 y.o. female, with a history of asthma and GERD, presenting to the ED with shortness of breath and wheezing beginning around 1645 today.  States she thinks it may have been the weather change and stepping out into the cold that brought on her symptoms.  She typically has a prescribed inhaler, but has run out. Denies fever/chills, recent illness, chest pain, N/V/D, abdominal pain, recent cough, or any other complaints.   Past Medical History:  Diagnosis Date  . Asthma   . GERD (gastroesophageal reflux disease)     Patient Active Problem List   Diagnosis Date Noted  . Health care maintenance 11/10/2013  . Personal history of contraception 11/10/2013  . Asthma, mild intermittent 09/10/2013    History reviewed. No pertinent surgical history.   OB History    Gravida  5   Para  4   Term  4   Preterm      AB      Living  4     SAB      TAB      Ectopic      Multiple      Live Births               Home Medications    Prior to Admission medications   Medication Sig Start Date End Date Taking? Authorizing Provider  albuterol (PROVENTIL HFA;VENTOLIN HFA) 108 (90 BASE) MCG/ACT inhaler Inhale 1-2 puffs into the lungs every 6 (six) hours as needed for wheezing or shortness of breath. Dispense with aerochamber 07/30/15   Melynda Ripple, MD  albuterol (PROVENTIL HFA;VENTOLIN HFA) 108 (90 Base) MCG/ACT inhaler Inhale 2 puffs into the lungs every 4 (four) hours as needed for wheezing or shortness of breath. 08/04/18   Tymia Streb C, PA-C  Multiple Vitamins-Calcium (ONE-A-DAY WOMENS FORMULA PO) Take 1 tablet by mouth daily.    [provider]    Family History Family  History  Problem Relation Age of Onset  . Diabetes Mother   . Hypertension Mother   . Hyperlipidemia Father   . Hypertension Father   . Diabetes Maternal Grandmother   . Kidney failure Maternal Grandmother     Social History Social History   Tobacco Use  . Smoking status: Never Smoker  . Smokeless tobacco: Never Used  Substance Use Topics  . Alcohol use: No  . Drug use: No     Allergies   Patient has no known allergies.   Review of Systems Review of Systems  Constitutional: Negative for chills, diaphoresis and fever.  Respiratory: Positive for shortness of breath. Negative for cough.   Cardiovascular: Negative for chest pain and leg swelling.  Gastrointestinal: Negative for abdominal pain, diarrhea, nausea and vomiting.  All other systems reviewed and are negative.    Physical Exam Updated Vital Signs BP 124/86 (BP Location: Left Arm)   Pulse 91   Resp (!) 40   Ht 5\' 1"  (1.549 m)   Wt 73 kg   SpO2 100%   BMI 30.42 kg/m   Physical Exam  Constitutional: She appears well-developed and well-nourished. No distress.  HENT:  Head: Normocephalic and atraumatic.  Eyes: Conjunctivae are normal.  Neck: Neck supple.  Cardiovascular: Normal rate, regular rhythm, normal heart sounds and intact distal pulses.  Pulmonary/Chest: Tachypnea noted. She has decreased breath sounds in the right lower field and the left lower field. She has wheezes in the right lower field and the left lower field.  Abdominal: Soft. There is no tenderness. There is no guarding.  Musculoskeletal: She exhibits no edema.  Lymphadenopathy:    She has no cervical adenopathy.  Neurological: She is alert.  Skin: Skin is warm and dry. She is not diaphoretic.  Psychiatric: She has a normal mood and affect. Her behavior is normal.  Nursing note and vitals reviewed.    ED Treatments / Results  Labs (all labs ordered are listed, but only abnormal results are displayed) Labs Reviewed  BASIC  METABOLIC PANEL - Abnormal; Notable for the following components:      Result Value   Potassium 3.1 (*)    Glucose, Bld 123 (*)    Calcium 8.7 (*)    All other components within normal limits  CBC WITH DIFFERENTIAL/PLATELET - Abnormal; Notable for the following components:   WBC 13.8 (*)    Lymphs Abs 5.8 (*)    All other components within normal limits  PREGNANCY, URINE  RAPID URINE DRUG SCREEN, HOSP PERFORMED    EKG None  Radiology No results found.  Procedures Procedures (including critical care time)  Medications Ordered in ED Medications  albuterol (PROVENTIL) (2.5 MG/3ML) 0.083% nebulizer solution (2.5 mg  Given 08/04/18 1737)  ipratropium-albuterol (DUONEB) 0.5-2.5 (3) MG/3ML nebulizer solution (3 mLs  Given 08/04/18 1737)  sodium chloride 0.9 % bolus 1,000 mL (0 mLs Intravenous Stopped 08/04/18 1922)  dexamethasone (DECADRON) injection 10 mg (10 mg Intravenous Given 08/04/18 1825)  albuterol (PROVENTIL HFA;VENTOLIN HFA) 108 (90 Base) MCG/ACT inhaler 2 puff (2 puffs Inhalation Given 08/04/18 1852)  potassium chloride SA (K-DUR,KLOR-CON) CR tablet 40 mEq (40 mEq Oral Given 08/04/18 1946)     Initial Impression / Assessment and Plan / ED Course  I have reviewed the triage vital signs and the nursing notes.  Pertinent labs & imaging results that were available during my care of the patient were reviewed by me and considered in my medical decision making (see chart for details).  Clinical Course as of Aug 04 2013  Mon Aug 04, 2018  1755 Patient states her breathing feels better.  She now "feels shaky" following the breathing treatment. Lung sounds are clear.  She declined another breathing treatment at this time.   [SJ]    Clinical Course User Index [SJ] Merilynn Haydu C, PA-C    Patient presents with an episode of shortness of breath.  Wheezing on exam.  Suspect asthma exacerbation due to weather.  Significant improvement over ED course. Patient has follow-up appointment  with her PCP in 2 days. Hypokalemia noted and addressed with the patient.  She will follow-up with her PCP on this matter. The patient was given instructions for home care as well as return precautions. Patient voices understanding of these instructions, accepts the plan, and is comfortable with discharge.  Findings and plan of care discussed with Quintella Reichert, MD.   Final Clinical Impressions(s) / ED Diagnoses   Final diagnoses:  Mild intermittent asthma with exacerbation    ED Discharge Orders         Ordered    albuterol (PROVENTIL HFA;VENTOLIN HFA) 108 (90 Base) MCG/ACT inhaler  Every 4 hours PRN     08/04/18 1952  Lorayne Bender, PA-C 08/04/18 2015    Quintella Reichert, MD 08/07/18 [phone]

## 2018-08-06 ENCOUNTER — Encounter: Payer: Self-pay | Admitting: Family Medicine

## 2018-08-06 ENCOUNTER — Ambulatory Visit (INDEPENDENT_AMBULATORY_CARE_PROVIDER_SITE_OTHER): Payer: 59 | Admitting: Family Medicine

## 2018-08-06 VITALS — BP 110/78 | HR 68 | Temp 98.1°F | Wt 163.0 lb

## 2018-08-06 DIAGNOSIS — E876 Hypokalemia: Secondary | ICD-10-CM

## 2018-08-06 DIAGNOSIS — K219 Gastro-esophageal reflux disease without esophagitis: Secondary | ICD-10-CM | POA: Diagnosis not present

## 2018-08-06 DIAGNOSIS — J4520 Mild intermittent asthma, uncomplicated: Secondary | ICD-10-CM

## 2018-08-06 LAB — POTASSIUM: POTASSIUM: 4.2 meq/L (ref 3.5–5.1)

## 2018-08-06 LAB — MAGNESIUM: Magnesium: 1.9 mg/dL (ref 1.5–2.5)

## 2018-08-06 MED ORDER — OMEPRAZOLE 20 MG PO CPDR: 20.0000 mg | DELAYED_RELEASE_CAPSULE | Freq: Every day | ORAL | 3 refills | Status: DC

## 2018-08-06 NOTE — Patient Instructions (Addendum)
We will recheck your potassium at today's visit.  If it is still low we will need to send you in some medicine to get it back up.  Asthma, Adult Asthma is a condition of the lungs in which the airways tighten and narrow. Asthma can make it hard to breathe. Asthma cannot be cured, but medicine and lifestyle changes can help control it. Asthma may be started (triggered) by:  Animal skin flakes (dander).  Dust.  Cockroaches.  Pollen.  Mold.  Smoke.  Cleaning products.  Hair sprays or aerosol sprays.  Paint fumes or strong smells.  Cold air, weather changes, and winds.  Crying or laughing hard.  Stress.  Certain medicines or drugs.  Foods, such as dried fruit, potato chips, and sparkling grape juice.  Infections or conditions (colds, flu).  Exercise.  Certain medical conditions or diseases.  Exercise or tiring activities.  Follow these instructions at home:  Take medicine as told by your doctor.  Use a peak flow meter as told by your doctor. A peak flow meter is a tool that measures how well the lungs are working.  Record and keep track of the peak flow meter's readings.  Understand and use the asthma action plan. An asthma action plan is a written plan for taking care of your asthma and treating your attacks.  To help prevent asthma attacks: ? Do not smoke. Stay away from secondhand smoke. ? Change your heating and air conditioning filter often. ? Limit your use of fireplaces and wood stoves. ? Get rid of pests (such as roaches and mice) and their droppings. ? Throw away plants if you see mold on them. ? Clean your floors. Dust regularly. Use cleaning products that do not smell. ? Have someone vacuum when you are not home. Use a vacuum cleaner with a HEPA filter if possible. ? Replace carpet with wood, tile, or vinyl flooring. Carpet can trap animal skin flakes and dust. ? Use allergy-proof pillows, mattress covers, and box spring covers. ? Wash bed sheets  and blankets every week in hot water and dry them in a dryer. ? Use blankets that are made of polyester or cotton. ? Clean bathrooms and kitchens with bleach. If possible, have someone repaint the walls in these rooms with mold-resistant paint. Keep out of the rooms that are being cleaned and painted. ? Wash hands often. Contact a doctor if:  You have make a whistling sound when breaking (wheeze), have shortness of breath, or have a cough even if taking medicine to prevent attacks.  The colored mucus you cough up (sputum) is thicker than usual.  The colored mucus you cough up changes from clear or white to yellow, green, gray, or bloody.  You have problems from the medicine you are taking such as: ? A rash. ? Itching. ? Swelling. ? Trouble breathing.  You need reliever medicines more than 2-3 times a week.  Your peak flow measurement is still at 50-79% of your personal best after following the action plan for 1 hour.  You have a fever. Get help right away if:  You seem to be worse and are not responding to medicine during an asthma attack.  You are short of breath even at rest.  You get short of breath when doing very little activity.  You have trouble eating, drinking, or talking.  You have chest pain.  You have a fast heartbeat.  Your lips or fingernails start to turn blue.  You are light-headed, dizzy, or faint.  Your peak flow is less than 50% of your personal best. This information is not intended to replace advice given to you by your health care provider. Make sure you discuss any questions you have with your health care provider. Document Released: 02/06/2008 Document Revised: 01/26/2016 Document Reviewed: 03/19/2013 Elsevier Interactive Patient Education  2017 [place] for Gastroesophageal Reflux Disease, Adult When you have gastroesophageal reflux disease (GERD), the foods you eat and your eating habits are very important. Choosing the  right foods can help ease your discomfort. What guidelines do I need to follow?  Choose fruits, vegetables, whole grains, and low-fat dairy products.  Choose low-fat meat, fish, and poultry.  Limit fats such as oils, salad dressings, butter, nuts, and avocado.  Keep a food diary. This helps you identify foods that cause symptoms.  Avoid foods that cause symptoms. These may be different for everyone.  Eat small meals often instead of 3 large meals a day.  Eat your meals slowly, in a place where you are relaxed.  Limit fried foods.  Cook foods using methods other than frying.  Avoid drinking alcohol.  Avoid drinking large amounts of liquids with your meals.  Avoid bending over or lying down until 2-3 hours after eating. What foods are not recommended? These are some foods and drinks that may make your symptoms worse: Vegetables Tomatoes. Tomato juice. Tomato and spaghetti sauce. Chili peppers. Onion and garlic. Horseradish. Fruits Oranges, grapefruit, and lemon (fruit and juice). Meats High-fat meats, fish, and poultry. This includes hot dogs, ribs, ham, sausage, salami, and bacon. Dairy Whole milk and chocolate milk. Sour cream. Cream. Butter. Ice cream. Cream cheese. Drinks Coffee and tea. Bubbly (carbonated) drinks or energy drinks. Condiments Hot sauce. Barbecue sauce. Sweets/Desserts Chocolate and cocoa. Donuts. Peppermint and spearmint. Fats and Oils High-fat foods. This includes [place] fries and potato chips. Other Vinegar. Strong spices. This includes black pepper, white pepper, red pepper, cayenne, curry powder, cloves, ginger, and chili powder. The items listed above may not be a complete list of foods and drinks to avoid. Contact your dietitian for more information. This information is not intended to replace advice given to you by your health care provider. Make sure you discuss any questions you have with your health care provider. Document Released:  02/19/2012 Document Revised: 01/26/2016 Document Reviewed: 06/24/2013 Elsevier Interactive Patient Education  2017 [place].  Hypokalemia Hypokalemia means that the amount of potassium in the blood is lower than normal.Potassium is a chemical that helps regulate the amount of fluid in the body (electrolyte). It also stimulates muscle tightening (contraction) and helps nerves work properly.Normally, most of the body's potassium is inside of cells, and only a very small amount is in the blood. Because the amount in the blood is so small, minor changes to potassium levels in the blood can be life-threatening. What are the causes? This condition may be caused by:  Antibiotic medicine.  Diarrhea or vomiting. Taking too much of a medicine that helps you have a bowel movement (laxative) can cause diarrhea and lead to hypokalemia.  Chronic kidney disease (CKD).  Medicines that help the body get rid of excess fluid (diuretics).  Eating disorders, such as bulimia.  Low magnesium levels in the body.  Sweating a lot.  What are the signs or symptoms? Symptoms of this condition include:  Weakness.  Constipation.  Fatigue.  Muscle cramps.  Mental confusion.  Skipped heartbeats or irregular heartbeat (palpitations).  Tingling or numbness.  How is this  diagnosed? This condition is diagnosed with a blood test. How is this treated? Hypokalemia can be treated by taking potassium supplements by mouth or adjusting the medicines that you take. Treatment may also include eating more foods that contain a lot of potassium. If your potassium level is very low, you may need to get potassium through an IV tube in one of your veins and be monitored in the hospital. Follow these instructions at home:  Take over-the-counter and prescription medicines only as told by your health care provider. This includes vitamins and supplements.  Eat a healthy diet. A healthy diet includes fresh fruits and  vegetables, whole grains, healthy fats, and lean proteins.  If instructed, eat more foods that contain a lot of potassium, such as: ? Nuts, such as peanuts and pistachios. ? Seeds, such as sunflower seeds and pumpkin seeds. ? Peas, lentils, and lima beans. ? Whole grain and bran cereals and breads. ? Fresh fruits and vegetables, such as apricots, avocado, bananas, cantaloupe, kiwi, oranges, tomatoes, asparagus, and potatoes. ? Orange juice. ? Tomato juice. ? Red meats. ? Yogurt.  Keep all follow-up visits as told by your health care provider. This is important. Contact a health care provider if:  You have weakness that gets worse.  You feel your heart pounding or racing.  You vomit.  You have diarrhea.  You have diabetes (diabetes mellitus) and you have trouble keeping your blood sugar (glucose) in your target range. Get help right away if:  You have chest pain.  You have shortness of breath.  You have vomiting or diarrhea that lasts for more than 2 days.  You faint. This information is not intended to replace advice given to you by your health care provider. Make sure you discuss any questions you have with your health care provider. Document Released: 08/20/2005 Document Revised: 04/07/2016 Document Reviewed: 04/07/2016 Elsevier Interactive Patient Education  2018 [employer].

## 2018-08-06 NOTE — Progress Notes (Signed)
Subjective:    Patient ID: Patricia Barron, female    DOB: [birth year], 41 y.o.   MRN: [id number]  No chief complaint on file.   HPI Patient was seen today for f/u on asthma.  Pt states she has been having more coughing, wheezing, and SOB with the change in season.  Pt has been out of her albuterol inhaler x " a while".  Pt went to the ED on 08/04/18 from work 2/2 asthma.  She was given an inhaler and an rx for an inhaler.   Pt noted to have hypokalemia on 12/2 and 06/03/18. Advised to f/u with pcp.  Pt notes her GERD has been bothering her more lately which may be contributing to her asthma.  Pt has been taking honey and other home remedies for her symptoms.  Pt works 3rd shift at the [employer], but does not eat past 10 pm.  Past Medical History:  Diagnosis Date  . Asthma   . GERD (gastroesophageal reflux disease)     No Known Allergies  ROS General: Denies fever, chills, night sweats, changes in weight, changes in appetite HEENT: Denies headaches, ear pain, changes in vision, rhinorrhea, sore throat CV: Denies CP, palpitations, orthopnea  +SOB Pulm:   +SOB, cough, wheezing GI: Denies abdominal pain, nausea, vomiting, diarrhea, constipation  +heartburn GU: Denies dysuria, hematuria, frequency, vaginal discharge Msk: Denies muscle cramps, joint pains Neuro: Denies weakness, numbness, tingling Skin: Denies rashes, bruising Psych: Denies depression, anxiety, hallucinations     Objective:    Blood pressure 110/78, pulse 68, temperature 98.1 F (36.7 C), temperature source Oral, weight 163 lb (73.9 kg), last menstrual period 07/14/2018, SpO2 97 %.  Gen. Pleasant, well-nourished, in no distress, normal affect  HEENT: Gate/AT, face symmetric, no scleral icterus, PERRLA, nares patent without drainage Lungs: no accessory muscle use, CTAB, no wheezes or rales Cardiovascular: RRR, no m/r/g, no peripheral edema  Neuro:  A&Ox3, CN II-XII intact, normal gait  Wt Readings from Last 3  Encounters:  08/06/18 163 lb (73.9 kg)  08/04/18 161 lb (73 kg)  10/10/17 158 lb 1.6 oz (71.7 kg)    Lab Results  Component Value Date   WBC 13.8 (H) 08/04/2018   HGB 12.9 08/04/2018   HCT 41.3 08/04/2018   PLT 265 08/04/2018   GLUCOSE 123 (H) 08/04/2018   CHOL 156 10/11/2017   TRIG 119.0 10/11/2017   HDL 35.90 (L) 10/11/2017   LDLCALC 96 10/11/2017   ALT 15 09/10/2013   AST 16 09/10/2013   NA 137 08/04/2018   K 3.1 (L) 08/04/2018   CL 106 08/04/2018   CREATININE 0.97 08/04/2018   BUN 8 08/04/2018   CO2 23 08/04/2018   TSH 2.32 10/11/2017    Assessment/Plan:  Mild intermittent asthma without complication -pt advised to keep her albuterol inhaler with her. -continue Albuterol q 6h prn -given handout -pt had FMLA paperwork with no return info.  Pt inquiring about where to fax forms. -Advised will need to f/u in clinic regularly.  Hypokalemia  - Plan: Potassium, Magnesium -if potassium is still low, will replete  Gastroesophageal reflux disease, esophagitis presence not specified  -given handout - Plan: omeprazole (PRILOSEC) 20 MG capsule  F/u prn  Grier Mitts, MD

## 2018-08-08 ENCOUNTER — Ambulatory Visit (HOSPITAL_COMMUNITY)
Admission: EM | Admit: 2018-08-08 | Discharge: 2018-08-08 | Disposition: A | Discharge status: Home / Self Care | Payer: 59 | Attending: Family Medicine | Admitting: Family Medicine

## 2018-08-08 ENCOUNTER — Encounter (HOSPITAL_COMMUNITY): Payer: Self-pay | Admitting: Family Medicine

## 2018-08-08 DIAGNOSIS — J069 Acute upper respiratory infection, unspecified: Secondary | ICD-10-CM | POA: Diagnosis not present

## 2018-08-08 MED ORDER — HYDROCODONE-HOMATROPINE 5-1.5 MG/5ML PO SYRP: 5.0000 mL | ORAL_SOLUTION | Freq: Four times a day (QID) | ORAL | 0 refills | Status: DC | PRN

## 2018-08-08 NOTE — ED Provider Notes (Signed)
[facility]    CSN: [id number] Arrival date & time: 08/08/18  1253     History   Chief Complaint Chief Complaint  Patient presents with  . URI    HPI Patricia Barron is a 42 y.o. female.   Is a 42 year old woman who is been to this facility before.  Patient has symptoms consistent with upper respiratory infection for the last 3 days.. She was seen in the emergency room 5 days ago and the next day she started having aches, myalgia, cough, and some increasing wheezing.  She works at the [employer].  She has not had a flu shot.  Patient has not had any fever.  She realized that this is not the flu unless she has a fever. Patient works at the post [employer]   Patient has a history of asthma.     Past Medical History:  Diagnosis Date  . Asthma   . GERD (gastroesophageal reflux disease)     Patient Active Problem List   Diagnosis Date Noted  . Health care maintenance 11/10/2013  . Personal history of contraception 11/10/2013  . Asthma, mild intermittent 09/10/2013    History reviewed. No pertinent surgical history.  OB History    Gravida  5   Para  4   Term  4   Preterm      AB      Living  4     SAB      TAB      Ectopic      Multiple      Live Births               Home Medications    Prior to Admission medications   Medication Sig Start Date End Date Taking? Authorizing Provider  albuterol (PROVENTIL HFA;VENTOLIN HFA) 108 (90 BASE) MCG/ACT inhaler Inhale 1-2 puffs into the lungs every 6 (six) hours as needed for wheezing or shortness of breath. Dispense with aerochamber 07/30/15   Melynda Ripple, MD  albuterol (PROVENTIL HFA;VENTOLIN HFA) 108 (90 Base) MCG/ACT inhaler Inhale 2 puffs into the lungs every 4 (four) hours as needed for wheezing or shortness of breath. 08/04/18   Joy, Shawn C, PA-C  HYDROcodone-homatropine (HYDROMET) 5-1.5 MG/5ML syrup Take 5 mLs by mouth every 6 (six) hours as needed for cough. 08/08/18    Robyn Haber, MD  Multiple Vitamins-Calcium (ONE-A-DAY WOMENS FORMULA PO) Take 1 tablet by mouth daily.    [provider]  omeprazole (PRILOSEC) 20 MG capsule Take 1 capsule (20 mg total) by mouth daily. 08/06/18   Billie Ruddy, MD    Family History Family History  Problem Relation Age of Onset  . Diabetes Mother   . Hypertension Mother   . Hyperlipidemia Father   . Hypertension Father   . Diabetes Maternal Grandmother   . Kidney failure Maternal Grandmother     Social History Social History   Tobacco Use  . Smoking status: Never Smoker  . Smokeless tobacco: Never Used  Substance Use Topics  . Alcohol use: No  . Drug use: No     Allergies   Patient has no known allergies.   Review of Systems Review of Systems   Physical Exam Triage Vital Signs ED Triage Vitals  Enc Vitals Group     BP      Pulse      Resp      Temp      Temp src      SpO2  Weight      Height      Head Circumference      Peak Flow      Pain Score      Pain Loc      Pain Edu?      Excl. in [place]?    No data found.  Updated Vital Signs BP 116/71 (BP Location: Right Arm)   Pulse 79   Temp 98.8 F (37.1 C) (Oral)   Resp 18   LMP 07/14/2018   SpO2 100%    Physical Exam  Constitutional: She is oriented to person, place, and time. She appears well-developed and well-nourished.  HENT:  Right Ear: External ear normal.  Left Ear: External ear normal.  Mouth/Throat: Oropharynx is clear and moist.  Eyes: Pupils are equal, round, and reactive to light. Conjunctivae are normal.  Neck: Normal range of motion. Neck supple.  Cardiovascular: Normal rate, regular rhythm and normal heart sounds.  Pulmonary/Chest: Effort normal and breath sounds normal.  Musculoskeletal: Normal range of motion.  Neurological: She is alert and oriented to person, place, and time.  Skin: Skin is warm and dry.  Psychiatric: She has a normal mood and affect. Her behavior is normal. Thought  content normal.  Nursing note and vitals reviewed.    UC Treatments / Results  Labs (all labs ordered are listed, but only abnormal results are displayed) Labs Reviewed - No data to display  EKG None  Radiology No results found.  Procedures Procedures (including critical care time)  Medications Ordered in UC Medications - No data to display  Initial Impression / Assessment and Plan / UC Course  I have reviewed the triage vital signs and the nursing notes.  Pertinent labs & imaging results that were available during my care of the patient were reviewed by me and considered in my medical decision making (see chart for details).    Final Clinical Impressions(s) / UC Diagnoses   Final diagnoses:  Viral upper respiratory tract infection   Discharge Instructions   None    ED Prescriptions    Medication Sig Dispense Auth. Provider   HYDROcodone-homatropine (HYDROMET) 5-1.5 MG/5ML syrup Take 5 mLs by mouth every 6 (six) hours as needed for cough. 60 mL Robyn Haber, MD     Controlled Substance Prescriptions [place] Controlled Substance Registry consulted? Not Applicable   Robyn Haber, MD 08/08/18 1326

## 2018-08-08 NOTE — ED Triage Notes (Signed)
Pt sts URI sx x 3 days  

## 2018-08-13 ENCOUNTER — Telehealth: Payer: Self-pay

## 2018-08-13 NOTE — Telephone Encounter (Signed)
Pt FMLA form was faxed to requested fax number

## 2018-09-18 IMAGING — CR DG CERVICAL SPINE COMPLETE 4+V
5 series · 5 of 5 positions shown · non-contrast
Comparison: None.

CLINICAL DATA: Shoulder and back pain.  Neck pain.

EXAM:
CERVICAL SPINE - COMPLETE 4+ VIEW

[w cervical spine lat]
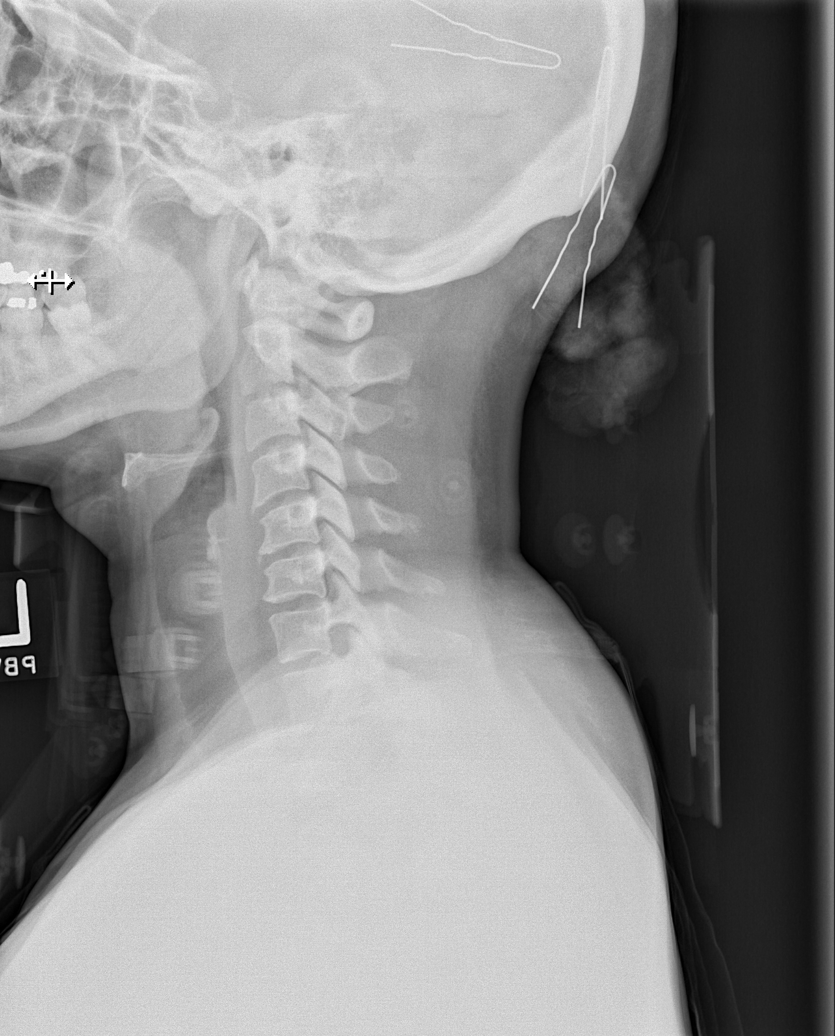

[w cervical spine ap_obl (1 of 2)]
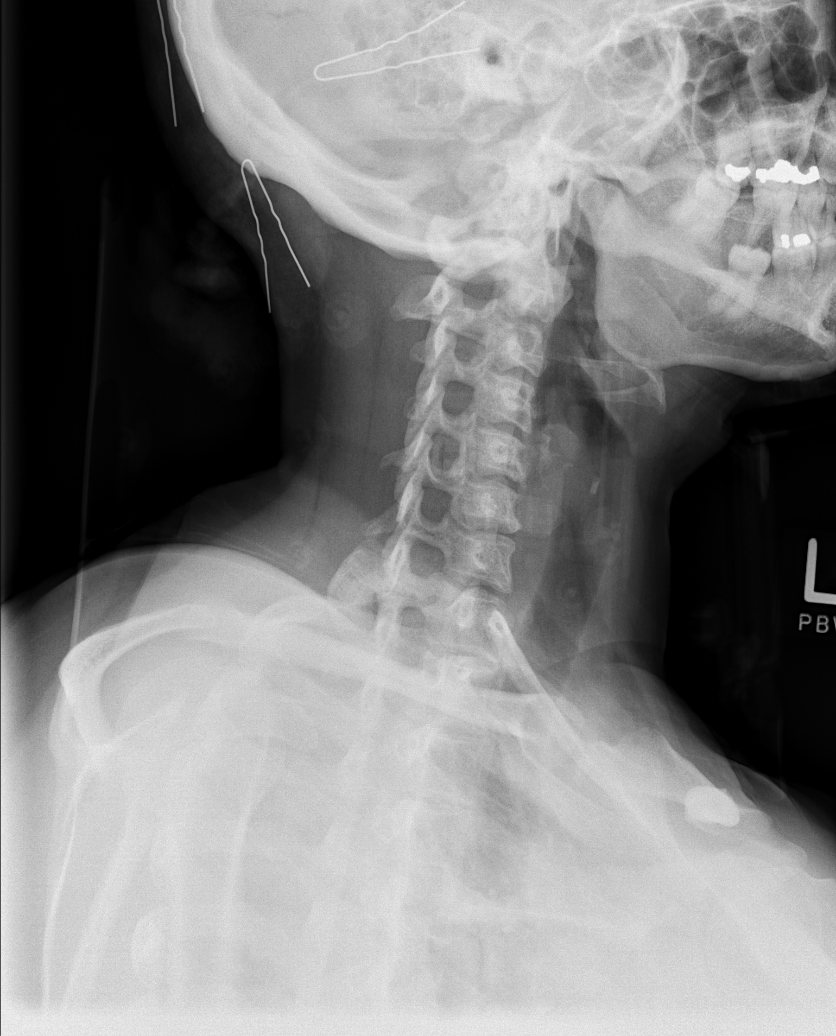

[w cervical spine ap_obl (2 of 2)]
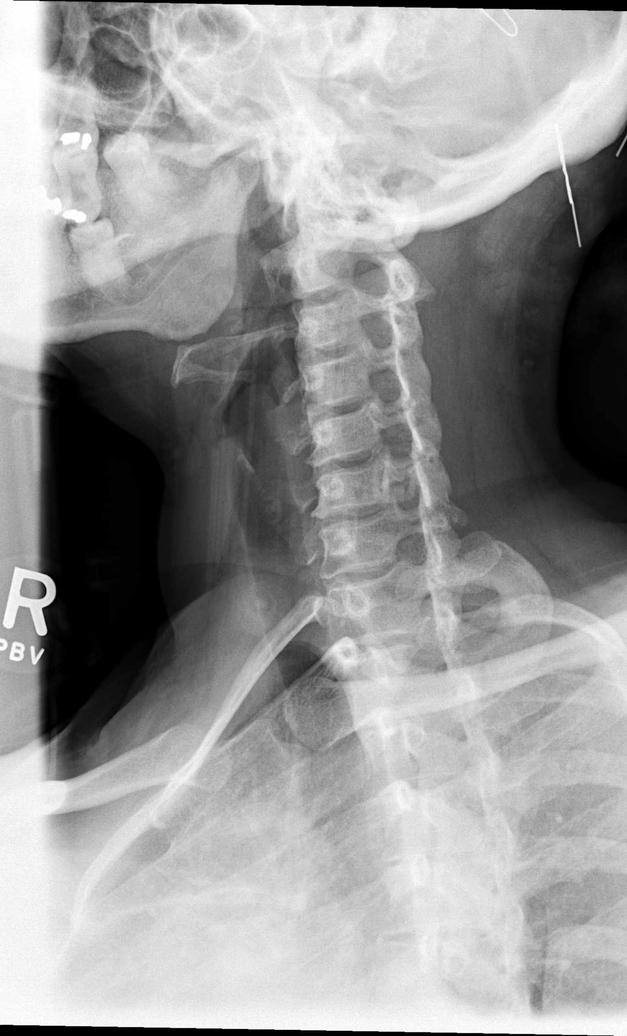

[w cervical spine ap]
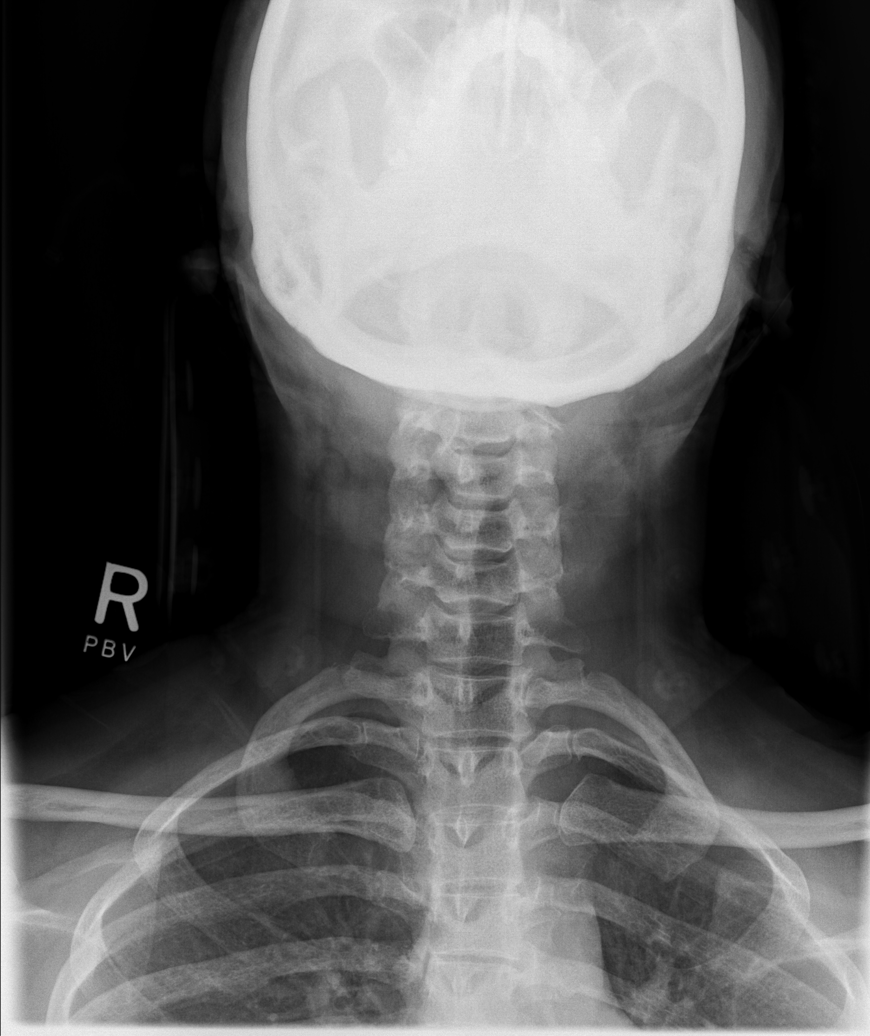

[w cervical spine odontoid]
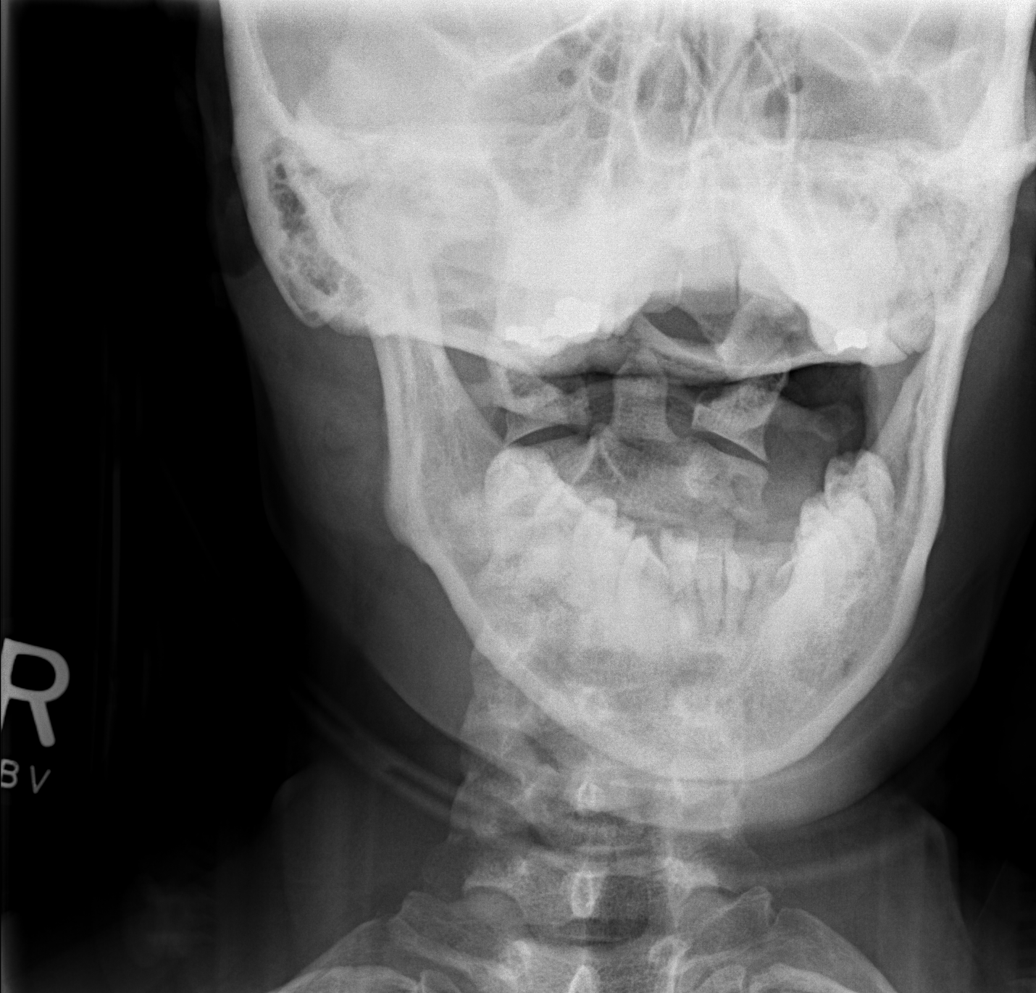

[5 of 5 positions shown; findings below may reference images not displayed]

FINDINGS: There is no evidence of cervical spine fracture or prevertebral soft
tissue swelling. Alignment is normal. Mild osteophyte formation at
C4-5 and C5-6. Intervertebral disc spaces are maintained. Vertebral
body heights are normal.
IMPRESSION: Minimal degenerative change.  No acute abnormality.
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
## 2018-11-15 ENCOUNTER — Ambulatory Visit (HOSPITAL_COMMUNITY)
Admission: EM | Admit: 2018-11-15 | Discharge: 2018-11-15 | Disposition: A | Discharge status: Home / Self Care | Payer: 59 | Attending: Family Medicine | Admitting: Family Medicine

## 2018-11-15 ENCOUNTER — Other Ambulatory Visit: Payer: Self-pay

## 2018-11-15 ENCOUNTER — Encounter (HOSPITAL_COMMUNITY): Payer: Self-pay | Admitting: Family Medicine

## 2018-11-15 DIAGNOSIS — J069 Acute upper respiratory infection, unspecified: Secondary | ICD-10-CM

## 2018-11-15 MED ORDER — CHLORHEXIDINE GLUCONATE 0.12 % MT SOLN: 15.0000 mL | Freq: Two times a day (BID) | OROMUCOSAL | 0 refills | Status: DC

## 2018-11-15 NOTE — ED Provider Notes (Signed)
[facility]    CSN: [id number] Arrival date & time: 11/15/18  1033     History   Chief Complaint Chief Complaint  Patient presents with  . Sore Throat  . Otalgia    HPI Patricia Barron is a 43 y.o. female.   This is a 43 year old woman who works for the [employer].  She has had 2 days of mild sore throat on the right with some tenderness in her right neck and some ear discomfort.  She is had no fever.  She has had no cough.     Past Medical History:  Diagnosis Date  . Asthma   . GERD (gastroesophageal reflux disease)     Patient Active Problem List   Diagnosis Date Noted  . Health care maintenance 11/10/2013  . Personal history of contraception 11/10/2013  . Asthma, mild intermittent 09/10/2013    History reviewed. No pertinent surgical history.  OB History    Gravida  5   Para  4   Term  4   Preterm      AB      Living  4     SAB      TAB      Ectopic      Multiple      Live Births               Home Medications    Prior to Admission medications   Medication Sig Start Date End Date Taking? Authorizing Provider  albuterol (PROVENTIL HFA;VENTOLIN HFA) 108 (90 BASE) MCG/ACT inhaler Inhale 1-2 puffs into the lungs every 6 (six) hours as needed for wheezing or shortness of breath. Dispense with aerochamber 07/30/15   Melynda Ripple, MD  albuterol (PROVENTIL HFA;VENTOLIN HFA) 108 (90 Base) MCG/ACT inhaler Inhale 2 puffs into the lungs every 4 (four) hours as needed for wheezing or shortness of breath. 08/04/18   Joy, Shawn C, PA-C  chlorhexidine (PERIDEX) 0.12 % solution Use as directed 15 mLs in the mouth or throat 2 (two) times daily. 11/15/18   Robyn Haber, MD  Multiple Vitamins-Calcium (ONE-A-DAY WOMENS FORMULA PO) Take 1 tablet by mouth daily.    [provider]  omeprazole (PRILOSEC) 20 MG capsule Take 1 capsule (20 mg total) by mouth daily. 08/06/18   Billie Ruddy, MD    Family History Family History   Problem Relation Age of Onset  . Diabetes Mother   . Hypertension Mother   . Hyperlipidemia Father   . Hypertension Father   . Diabetes Maternal Grandmother   . Kidney failure Maternal Grandmother     Social History Social History   Tobacco Use  . Smoking status: Never Smoker  . Smokeless tobacco: Never Used  Substance Use Topics  . Alcohol use: No  . Drug use: No     Allergies   Patient has no known allergies.   Review of Systems Review of Systems   Physical Exam Triage Vital Signs ED Triage Vitals  Enc Vitals Group     BP      Pulse      Resp      Temp      Temp src      SpO2      Weight      Height      Head Circumference      Peak Flow      Pain Score      Pain Loc      Pain Edu?  Excl. in [place]?    No data found.  Updated Vital Signs BP 122/68 (BP Location: Right Arm)   Pulse 68   Temp 98.4 F (36.9 C) (Oral)   Resp 18   Wt 72.6 kg   LMP 11/12/2018   SpO2 100%   BMI 30.23 kg/m    Physical Exam Vitals signs and nursing note reviewed.  Constitutional:      Appearance: She is well-developed. She is obese.  HENT:     Head: Normocephalic.     Right Ear: Tympanic membrane normal.     Left Ear: Tympanic membrane normal.     Mouth/Throat:     Mouth: Mucous membranes are moist.     Pharynx: Oropharynx is clear.     Tonsils: No tonsillar exudate.  Eyes:     Conjunctiva/sclera: Conjunctivae normal.  Neck:     Musculoskeletal: Normal range of motion and neck supple.  Pulmonary:     Effort: Pulmonary effort is normal.  Lymphadenopathy:     Cervical: No cervical adenopathy.  Skin:    General: Skin is warm and dry.  Neurological:     General: No focal deficit present.     Mental Status: She is alert.  Psychiatric:        Mood and Affect: Mood normal.      UC Treatments / Results  Labs (all labs ordered are listed, but only abnormal results are displayed) Labs Reviewed - No data to display  EKG None  Radiology No results  found.  Procedures Procedures (including critical care time)  Medications Ordered in UC Medications - No data to display  Initial Impression / Assessment and Plan / UC Course  I have reviewed the triage vital signs and the nursing notes.  Pertinent labs & imaging results that were available during my care of the patient were reviewed by me and considered in my medical decision making (see chart for details).    Final Clinical Impressions(s) / UC Diagnoses   Final diagnoses:  Upper respiratory tract infection, unspecified type   Discharge Instructions   None    ED Prescriptions    Medication Sig Dispense Auth. Provider   chlorhexidine (PERIDEX) 0.12 % solution Use as directed 15 mLs in the mouth or throat 2 (two) times daily. 120 mL Robyn Haber, MD     Controlled Substance Prescriptions [place] Controlled Substance Registry consulted? Not Applicable   Robyn Haber, MD 11/15/18 1228

## 2018-11-15 NOTE — ED Triage Notes (Signed)
Pt cc sore throat and right ear. X  2 days.

## 2019-01-28 DIAGNOSIS — Z3040 Encounter for surveillance of contraceptives, unspecified: Secondary | ICD-10-CM | POA: Diagnosis not present

## 2019-01-28 DIAGNOSIS — M25569 Pain in unspecified knee: Secondary | ICD-10-CM | POA: Diagnosis not present

## 2019-01-28 DIAGNOSIS — Z3202 Encounter for pregnancy test, result negative: Secondary | ICD-10-CM | POA: Diagnosis not present

## 2019-02-05 DIAGNOSIS — R791 Abnormal coagulation profile: Secondary | ICD-10-CM | POA: Diagnosis not present

## 2019-02-16 DIAGNOSIS — M25561 Pain in right knee: Secondary | ICD-10-CM | POA: Diagnosis not present

## 2019-02-24 DIAGNOSIS — M25561 Pain in right knee: Secondary | ICD-10-CM | POA: Diagnosis not present

## 2019-02-26 DIAGNOSIS — M25561 Pain in right knee: Secondary | ICD-10-CM | POA: Diagnosis not present

## 2019-03-12 DIAGNOSIS — F4321 Adjustment disorder with depressed mood: Secondary | ICD-10-CM | POA: Diagnosis not present

## 2019-03-17 DIAGNOSIS — F4321 Adjustment disorder with depressed mood: Secondary | ICD-10-CM | POA: Diagnosis not present

## 2019-04-02 ENCOUNTER — Telehealth: Payer: Self-pay | Admitting: Family Medicine

## 2019-04-02 NOTE — Telephone Encounter (Signed)
Patient is calling schedule a CPE with Dr. Volanda Napoleon. Please advise CB- [phone]

## 2019-04-23 ENCOUNTER — Other Ambulatory Visit: Payer: Self-pay

## 2019-04-23 ENCOUNTER — Ambulatory Visit (INDEPENDENT_AMBULATORY_CARE_PROVIDER_SITE_OTHER): Payer: Federal, State, Local not specified - PPO | Admitting: Family Medicine

## 2019-04-23 ENCOUNTER — Encounter: Payer: Self-pay | Admitting: Family Medicine

## 2019-04-23 VITALS — BP 128/88 | HR 82 | Temp 98.4°F | Wt 166.0 lb

## 2019-04-23 DIAGNOSIS — Z131 Encounter for screening for diabetes mellitus: Secondary | ICD-10-CM

## 2019-04-23 DIAGNOSIS — Z1329 Encounter for screening for other suspected endocrine disorder: Secondary | ICD-10-CM | POA: Diagnosis not present

## 2019-04-23 DIAGNOSIS — Z13220 Encounter for screening for lipoid disorders: Secondary | ICD-10-CM

## 2019-04-23 DIAGNOSIS — Z0000 Encounter for general adult medical examination without abnormal findings: Secondary | ICD-10-CM

## 2019-04-23 DIAGNOSIS — Z113 Encounter for screening for infections with a predominantly sexual mode of transmission: Secondary | ICD-10-CM

## 2019-04-23 LAB — LIPID PANEL
Cholesterol: 164 mg/dL (ref 0–200)
HDL: 40.5 mg/dL (ref >39.00)
LDL Cholesterol: 103 mg/dL — ABNORMAL HIGH (ref 0–99)
NonHDL: 123.94
Total CHOL/HDL Ratio: 4
Triglycerides: 103 mg/dL (ref 0.0–149.0)
VLDL: 20.6 mg/dL (ref 0.0–40.0)

## 2019-04-23 LAB — TSH: TSH: 0.96 u[IU]/mL (ref 0.35–4.50)

## 2019-04-23 LAB — CBC WITH DIFFERENTIAL/PLATELET
Basophils Absolute: 0.1 10*3/uL (ref 0.0–0.1)
Basophils Relative: 0.8 % (ref 0.0–3.0)
Eosinophils Absolute: 0.1 10*3/uL (ref 0.0–0.7)
Eosinophils Relative: 1.1 % (ref 0.0–5.0)
HCT: 37.3 % (ref 36.0–46.0)
Hemoglobin: 12.2 g/dL (ref 12.0–15.0)
Lymphocytes Relative: 28.5 % (ref 12.0–46.0)
Lymphs Abs: 2.7 10*3/uL (ref 0.7–4.0)
MCHC: 32.8 g/dL (ref 30.0–36.0)
MCV: 85.1 fl (ref 78.0–100.0)
Monocytes Absolute: 0.6 10*3/uL (ref 0.1–1.0)
Monocytes Relative: 6.6 % (ref 3.0–12.0)
Neutro Abs: 5.9 10*3/uL (ref 1.4–7.7)
Neutrophils Relative %: 63 % (ref 43.0–77.0)
Platelets: 207 10*3/uL (ref 150.0–400.0)
RBC: 4.38 Mil/uL (ref 3.87–5.11)
RDW: 15.4 % (ref 11.5–15.5)
WBC: 9.3 10*3/uL (ref 4.0–10.5)

## 2019-04-23 LAB — T4, FREE: Free T4: 0.78 ng/dL (ref 0.60–1.60)

## 2019-04-23 LAB — BASIC METABOLIC PANEL
BUN: 11 mg/dL (ref 6–23)
CO2: 24 mEq/L (ref 19–32)
Calcium: 8.7 mg/dL (ref 8.4–10.5)
Chloride: 105 mEq/L (ref 96–112)
Creatinine, Ser: 0.86 mg/dL (ref 0.40–1.20)
GFR: 86.61 mL/min (ref >60.00)
Glucose, Bld: 93 mg/dL (ref 70–99)
Potassium: 4.4 mEq/L (ref 3.5–5.1)
Sodium: 136 mEq/L (ref 135–145)

## 2019-04-23 LAB — HEMOGLOBIN A1C: Hgb A1c MFr Bld: 6.1 % (ref 4.6–6.5)

## 2019-04-23 NOTE — Patient Instructions (Signed)

## 2019-04-23 NOTE — Progress Notes (Signed)
Subjective:     Patricia Barron is a 43 y.o. female and is here for a comprehensive physical exam. The patient reports no problems.  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  Patient states several colleagues have become sick COVID however she has been fortunate not to be sick.  Patient endorses asthma.  Has albuterol inhaler as needed    Social History   Socioeconomic History  . Marital status: Single    Spouse name: Not on file  . Number of children: Not on file  . Years of education: Not on file  . Highest education level: Not on file  Occupational History  . Occupation: Therapist, art  Social Needs  . Financial resource strain: Not on file  . Food insecurity    Worry: Not on file    Inability: Not on file  . Transportation needs    Medical: Not on file    Non-medical: Not on file  Tobacco Use  . Smoking status: Never Smoker  . Smokeless tobacco: Never Used  Substance and Sexual Activity  . Alcohol use: No  . Drug use: No  . Sexual activity: Never    Birth control/protection: None  Lifestyle  . Physical activity    Days per week: Not on file    Minutes per session: Not on file  . Stress: Not on file  Relationships  . Social Herbalist on phone: Not on file    Gets together: Not on file    Attends religious service: Not on file    Active member of club or organization: Not on file    Attends meetings of clubs or organizations: Not on file    Relationship status: Not on file  . Intimate partner violence    Fear of current or ex partner: Not on file    Emotionally abused: Not on file    Physically abused: Not on file    Forced sexual activity: Not on file  Other Topics Concern  . 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 HIV Screening  01/01/1990  . PAP SMEAR-Modifier  09/10/2016  . INFLUENZA VACCINE  04/04/2019  . TETANUS/TDAP   09/11/2023    The following portions of the patient's history were reviewed and updated as appropriate: allergies, current medications, past family history, past medical history, past social history, past surgical history and problem list.  Review of Systems Pertinent items noted in HPI and remainder of comprehensive ROS otherwise negative.   Objective:    BP 128/88 (BP Location: Left Arm, Patient Position: Sitting, Cuff Size: Normal)   Pulse 82   Temp 98.4 F (36.9 C) (Oral)   Wt 166 lb (75.3 kg)   LMP 02/18/2019 (Exact Date)   SpO2 98%   BMI 31.37 kg/m  General appearance: alert, cooperative and no distress Head: Normocephalic, without obvious abnormality, atraumatic Eyes: conjunctivae/corneas clear. PERRL, EOM's intact. Fundi benign. Ears: normal TM's and external ear canals both ears Nose: Nares normal. Septum midline. Mucosa normal. No drainage or sinus tenderness. Throat: lips, mucosa, and tongue normal; teeth and gums normal Neck: no adenopathy, no carotid bruit, no JVD, supple, symmetrical, trachea midline and thyroid not enlarged, symmetric, no tenderness/mass/nodules Lungs: clear to auscultation bilaterally Heart: regular rate and rhythm, S1, S2 normal, no murmur, click, rub or gallop Abdomen: soft,  non-tender; bowel sounds normal; no masses,  no organomegaly Extremities: extremities normal, atraumatic, no cyanosis or edema Pulses: 2+ and symmetric Skin: Skin color, texture, turgor normal. No rashes or lesions Lymph nodes: Cervical, supraclavicular, and axillary nodes normal. Neurologic: Alert and oriented X 3, normal strength and tone. Normal symmetric reflexes. Normal coordination and gait    Assessment:    Healthy female exam.      Plan:     Anticipatory guidance given including wearing seatbelts, smoke detectors in the home, increasing physical activity, increasing p.o. intake of water and vegetables. -will obtain labs -offered influenza vaccine.  Pt  declined. -given handout to schedule mammogram -will return for pap -given handout -next CPE in 1 yr See After Visit Summary for Counseling Recommendations    Screening for STI -We will obtain RPR, HIV, GC  Screen for cholesterol -We will obtain lipid  Screen for diabetes -We will obtain hemoglobin A1c  Screen for thyroid disorder -Obtain TSH, free T4  Follow-up PRN  Grier Mitts, MD

## 2019-04-24 LAB — RPR: RPR Ser Ql: NONREACTIVE

## 2019-04-24 LAB — HIV ANTIBODY (ROUTINE TESTING W REFLEX): HIV 1&2 Ab, 4th Generation: NONREACTIVE

## 2019-04-24 LAB — C. TRACHOMATIS/N. GONORRHOEAE RNA
C. trachomatis RNA, TMA: NOT DETECTED
N. gonorrhoeae RNA, TMA: NOT DETECTED

## 2019-05-02 DIAGNOSIS — F4321 Adjustment disorder with depressed mood: Secondary | ICD-10-CM | POA: Diagnosis not present

## 2019-05-05 DIAGNOSIS — J029 Acute pharyngitis, unspecified: Secondary | ICD-10-CM | POA: Diagnosis not present

## 2019-05-05 DIAGNOSIS — Z3040 Encounter for surveillance of contraceptives, unspecified: Secondary | ICD-10-CM | POA: Diagnosis not present

## 2019-05-06 ENCOUNTER — Other Ambulatory Visit: Payer: Self-pay

## 2019-05-06 ENCOUNTER — Ambulatory Visit (INDEPENDENT_AMBULATORY_CARE_PROVIDER_SITE_OTHER): Payer: Federal, State, Local not specified - PPO | Admitting: Family Medicine

## 2019-05-06 ENCOUNTER — Other Ambulatory Visit (HOSPITAL_COMMUNITY)
Admission: RE | Admit: 2019-05-06 | Discharge: 2019-05-06 | Disposition: A | Discharge status: Home / Self Care | Payer: Federal, State, Local not specified - PPO | Source: Ambulatory Visit | Attending: Family Medicine | Admitting: Family Medicine

## 2019-05-06 ENCOUNTER — Encounter: Payer: Self-pay | Admitting: Family Medicine

## 2019-05-06 VITALS — BP 128/84 | HR 80 | Temp 96.8°F | Wt 162.0 lb

## 2019-05-06 DIAGNOSIS — Z124 Encounter for screening for malignant neoplasm of cervix: Secondary | ICD-10-CM

## 2019-05-06 NOTE — Progress Notes (Signed)
Subjective:    Patient ID: Patricia Barron, female    DOB: March 31, 1976, 43 y.o.   MRN: [id number]  No chief complaint on file.   HPI Patient was seen today for pap.  Denies vaginal d/c, irritation, dysuria.  Seen last wk for CPE.  Past Medical History:  Diagnosis Date  . Asthma   . GERD (gastroesophageal reflux disease)     No Known Allergies  ROS General: Denies fever, chills, night sweats, changes in weight, changes in appetite HEENT: Denies headaches, ear pain, changes in vision, rhinorrhea, sore throat CV: Denies CP, palpitations, SOB, orthopnea Pulm: Denies SOB, cough, wheezing GI: Denies abdominal pain, nausea, vomiting, diarrhea, constipation GU: Denies dysuria, hematuria, frequency, vaginal discharge Msk: Denies muscle cramps, joint pains Neuro: Denies weakness, numbness, tingling Skin: Denies rashes, bruising Psych: Denies depression, anxiety, hallucinations      Objective:    Blood pressure 128/84, pulse 80, temperature (!) 96.8 F (36 C), temperature source Oral, weight 162 lb (73.5 kg), last menstrual period 03/03/2019, SpO2 98 %.  Gen. Pleasant, well-nourished, in no distress, normal affect   HEENT: [place]/AT, face symmetric, no scleral icterus, PERRLA, nares patent without drainge Lungs: no accessory muscle use, CTAB, no wheezes or rales Cardiovascular: RRR, no peripheral edema Pelvic:normal female external genitalia without lesions. Normal urethral meatus.  No vaginal d/c, normal vaginal rugae without lesions. Cervix without lesions or d/c.  Uterus normal in size, no TTP no CMT.  No masses appreciated.  Pap collected. Neuro:  A&Ox3, CN II-XII intact, normal gait   Wt Readings from Last 3 Encounters:  05/06/19 162 lb (73.5 kg)  04/23/19 166 lb (75.3 kg)  11/15/18 160 lb (72.6 kg)    Lab Results  Component Value Date   WBC 9.3 04/23/2019   HGB 12.2 04/23/2019   HCT 37.3 04/23/2019   PLT 207.0 04/23/2019   GLUCOSE 93 04/23/2019   CHOL 164 04/23/2019    TRIG 103.0 04/23/2019   HDL 40.50 04/23/2019   LDLCALC 103 (H) 04/23/2019   ALT 15 09/10/2013   AST 16 09/10/2013   NA 136 04/23/2019   K 4.4 04/23/2019   CL 105 04/23/2019   CREATININE 0.86 04/23/2019   BUN 11 04/23/2019   CO2 24 04/23/2019   TSH 0.96 04/23/2019   HGBA1C 6.1 04/23/2019    Assessment/Plan:  Encounter for Papanicolaou smear for cervical cancer screening  -pap collected -given handout  F/u prn  Grier Mitts, MD

## 2019-05-06 NOTE — Patient Instructions (Signed)
Cancer Screening for Women A cancer screening is a test or exam that checks for cancer. Your health care provider will recommend specific cancer screenings based on your age, personal history, and family history of cancer. Work with your health care provider to create a cancer screening schedule that protects your health. Why is cancer screening done? Cancer screening is done to look for cancer in the very early stages, before it spreads and becomes harder to treat and before you would start to notice symptoms. Finding cancer early improves the chances of successful treatment. It may save your life. Who should be screened for cancer? All women should be screened for certain cancers, including breast cancer, cervical cancer, and skin cancer. Your health care provider may recommend screenings for other types of cancer if:  You had cancer before.  You have a family member with cancer.  You have abnormal genes that could increase the risk of cancer.  You have risk factors for certain cancers, such as smoking. When you should be screened for cancer depends on:  Your age.  Your medical history and your family's medical history.  Certain lifestyle factors, such as smoking.  Environmental exposure, such as to asbestos. What are some common cancer screenings? Breast cancer Breast cancer screening is done with a test that takes images of breast tissue (mammogram). Here are some screening guidelines:  When you are age 40-44, you will be given the choice to start having mammograms.  When you are age 45-54, you should have a mammogram every year.  You may start having mammograms before age 45 if you have risk factors for breast cancer, such as having an immediate family member with breast cancer.  When you are age 55 or older, you should have a mammogram every 1-2 years for as long as you are in good health and have a life expectancy of 10 years or more.  It is important to know what your  breasts look and feel like so you can report any changes to your health care provider.  Cervical cancer Cervical cancer screening is done with a Pap test. This testchecks for abnormalities, including the virus that causes cervical cancer (human papillomavirus, or HPV). To perform the test, a health care provider takes a swab of cervical cells during a pelvic exam. Screening for cervical cancer with a Pap test should start at age 21. Here are some screening guidelines:  When you are age 21-29, you should have a Pap test every 3 years.  When you are age 30-65, you should have a Pap test and HPV test every 5 years or have a Pap test every 3 years.  You may be screened for cervical cancer more often if you have risk factors for cervical cancer.  If your Pap tests are abnormal, you may have an HPV test.  If you have had the HPV vaccine, you will still be screened for cervical cancer and follow normal screening recommendations. You do not need to be screened for cervical cancer if any of the following apply to you:  You are older than age 65 and you have not had a serious cervical precancer or cancer in the last 20 years.  Your cervix and uterus have been removed and you have never had cervical cancer or precancerous cells. Endometrial cancer There is no standard screening test for endometrial cancer, but the cancer can be detected with:  A test of a sample of tissue taken from the lining of the uterus (endometrial tissue   biopsy).  A vaginal ultrasound.  Pap tests. If you are at increased risk for endometrial cancer, you may need to have these tests more often than normal. You are at increased risk if:  You have a family history of ovarian, uterine, or colon cancer.  You are taking tamoxifen, a drug that is used to treat breast cancer.  You have certain types of colon cancer. If you have reached menopause, it is especially important to talk with your health care provider about any vaginal  bleeding or spotting. Screening for endometrial cancer is not recommended for women who do not have symptoms of the cancer, such as vaginal bleeding. Colorectal cancer  All adults should have screening for colorectal cancer starting at age 50 and continuing until age 75. Your health care provider may recommend screening at age 45. You will have tests every 1-10 years, depending on your results and the type of screening test. If you have a family history of colon or rectal cancer or other risk factors, you may need to start having screenings earlier. Talk with your health care provider about which screening test is right for you and how often you should be screened. Colorectal cancer screening looks for cancer or for growths called polyps that often form before cancer starts. Tests to look for cancer or polyps include:  Colonoscopy or flexible sigmoidoscopy. For these procedures, a flexible tube with a small camera is inserted into the rectum.  CT colonography. This test uses X-rays and a contrast dye to check the colon for polyps. If a polyp is found, you may need to have a colonoscopy so the polyp can be located and removed. Tests to look for cancer in the stool (feces) include:  Guaiac-based fecal occult blood test (FOBT). This test detects blood in stool. It can be done at home with a kit.  Fecal immunochemical test (FIT). This test detects blood in stool. For this test, you will need to collect stool samples at home.  Stool DNA test. This test looks for blood in stool and any changes in DNA that can lead to colon cancer. For this test, you will need to collect a stool sample at home and send it to a lab.  Skin cancer Skin cancer screening is done by checking the skin for unusual moles or spots and any changes in existing moles. Your health care provider should check your skin for signs of skin cancer at every physical exam. You should check your skin every month and tell your health care  provider right away if anything looks unusual. Women with a higher-than-normal risk for skin cancer may want to see a skin specialist (dermatologist) for an annual body check. Lung cancer Lung cancer screening is done with a CT scan that looks for abnormal cells in the lungs. Discuss lung cancer screening with your health care provider if you are 55-74 years old and if any of the following apply to you:  You currently smoke.  You used to smoke heavily.  You have a smoking history of 1 pack a day for 30 years or 2 packs a day for 15 years.  You have quit smoking within the past 15 years. If you smoke heavily or if you used to smoke, you may need to be screened every year. Where to find more information  National Cancer Institute: https://www.cancer.gov/about-cancer/screening  Centers for Disease Control and Prevention: https://www.cdc.gov/cancer/dcpc/prevention/screening.htm  Department of Health and Human Services: https://www.womenshealth.gov/screening-tests-and-vaccines/screening-tests-for-women Contact a health care provider if:  You have   concerns about any signs or symptoms of cancer, such as: ? Moles that have an unusual shape or color. ? Changes in existing moles. ? A sore on your skin that does not heal. ? Blood in your stool. ? Fatigue that does not go away. ? Frequent pain or cramping in your abdomen. ? Coughing, or coughing up blood. ? Losing weight without trying. ? Lumps or other changes in your breasts. ? Vaginal bleeding, spotting, or changes in your periods. Summary  Be aware of and watch for signs and symptoms of cancer, especially symptoms of breast cancer, cervical cancer, endometrial cancer, colorectal cancer, skin cancer, and lung cancer.  Early detection of cancer with cancer screening may save your life.  Talk with your health care provider about your specific cancer risks.  Work together with your health care provider to create a cancer screening plan  that is right for you. This information is not intended to replace advice given to you by your health care provider. Make sure you discuss any questions you have with your health care provider. Document Released: 05/17/2016 Document Revised: 12/10/2018 Document Reviewed: 05/17/2016 Elsevier Patient Education  2020 [employer]  

## 2019-05-07 LAB — CYTOLOGY - PAP
Diagnosis: NEGATIVE
HPV: NOT DETECTED
Trichomonas: NEGATIVE

## 2019-08-12 ENCOUNTER — Other Ambulatory Visit: Payer: Self-pay

## 2019-08-13 ENCOUNTER — Encounter: Payer: Self-pay | Admitting: Gastroenterology

## 2019-08-13 ENCOUNTER — Encounter: Payer: Self-pay | Admitting: Family Medicine

## 2019-08-13 ENCOUNTER — Ambulatory Visit: Payer: Federal, State, Local not specified - PPO | Admitting: Family Medicine

## 2019-08-13 VITALS — BP 122/62 | HR 87 | Temp 98.0°F | Wt 164.0 lb

## 2019-08-13 DIAGNOSIS — R1013 Epigastric pain: Secondary | ICD-10-CM | POA: Diagnosis not present

## 2019-08-13 DIAGNOSIS — K219 Gastro-esophageal reflux disease without esophagitis: Secondary | ICD-10-CM

## 2019-08-13 MED ORDER — OMEPRAZOLE 20 MG PO CPDR: 20.0000 mg | DELAYED_RELEASE_CAPSULE | Freq: Two times a day (BID) | ORAL | 3 refills | Status: DC

## 2019-08-13 NOTE — Progress Notes (Signed)
Subjective:    Patient ID: Patricia Barron, female    DOB: Jan 27, 1976, 43 y.o.   MRN: [id number]  Chief Complaint  Patient presents with  . Heartburn    Pt states for the last 3 days every time she eats it burns her throat "on the way down and then feels like it gets stuck"     HPI Pt is a 44 yo female with pmh sig for asthma, GERD.  Patient seen today for acute concern.  Burning in esophagus and epigastric area with eating or drinking x 3 days.  Feels like food gets stuck at the bottom of her esophagus causing pain.  Pt sleeping propped up on pillows.  Tried aloe for her symptoms.  Not currently on PPI.  Pt denies nausea/vomiting, changes in diet, weight gain or loss.  Past Medical History:  Diagnosis Date  . Asthma   . GERD (gastroesophageal reflux disease)     No Known Allergies  ROS General: Denies fever, chills, night sweats, changes in weight, changes in appetite HEENT: Denies headaches, ear pain, changes in vision, rhinorrhea, sore throat CV: Denies CP, palpitations, SOB, orthopnea Pulm: Denies SOB, cough, wheezing GI: Deniesnausea, vomiting, diarrhea, constipation  +epigastric abd pain, burning, reflux. GU: Denies dysuria, hematuria, frequency, vaginal discharge Msk: Denies muscle cramps, joint pains Neuro: Denies weakness, numbness, tingling Skin: Denies rashes, bruising Psych: Denies depression, anxiety, hallucinations    Objective:    Blood pressure 122/62, pulse 87, temperature 98 F (36.7 C), temperature source Temporal, weight 164 lb (74.4 kg), SpO2 98 %.   Gen. Pleasant, well-nourished, in no distress, normal affect   HEENT: [place]/AT, face symmetric, conjunctiva clear, no scleral icterus, PERRLA, EOMI, nares patent without drainage, pharynx without erythema or exudate. No cervical lymphadenopathy Lungs: no accessory muscle use, CTAB, no wheezes or rales Cardiovascular: RRR, no m/r/g, no peripheral edema Abdomen: BS present, soft, mild TTP in epigastric area,  ND, no hepatosplenomegaly. Musculoskeletal: No deformities, no cyanosis or clubbing, normal tone Neuro:  A&Ox3, CN II-XII intact, normal gait Skin:  Warm, no lesions/ rash   Wt Readings from Last 3 Encounters:  08/13/19 164 lb (74.4 kg)  05/06/19 162 lb (73.5 kg)  04/23/19 166 lb (75.3 kg)    Lab Results  Component Value Date   WBC 9.3 04/23/2019   HGB 12.2 04/23/2019   HCT 37.3 04/23/2019   PLT 207.0 04/23/2019   GLUCOSE 93 04/23/2019   CHOL 164 04/23/2019   TRIG 103.0 04/23/2019   HDL 40.50 04/23/2019   LDLCALC 103 (H) 04/23/2019   ALT 15 09/10/2013   AST 16 09/10/2013   NA 136 04/23/2019   K 4.4 04/23/2019   CL 105 04/23/2019   CREATININE 0.86 04/23/2019   BUN 11 04/23/2019   CO2 24 04/23/2019   TSH 0.96 04/23/2019   HGBA1C 6.1 04/23/2019    Assessment/Plan:  Gastroesophageal reflux disease, unspecified whether esophagitis present  -possibly caused/worsended by a hiatal hernia. -Restart Prilosec.  Will take 20 mg twice daily -will place referral to GI for EGD -Continue lifestyle modifications -Avoid foods known to cause symptoms -given handouts -Given precautions - Plan: omeprazole (PRILOSEC) 20 MG capsule, Ambulatory referral to Gastroenterology  Epigastric pain -discussed possible causes irritation from acid reflux, gastric ulcer -will restart prilosec 20 mg BID  F/u prn in the next wk  Grier Mitts, MD  This note is not being shared with the patient for the following reason: To prevent harm (release of this note would result in harm to  the life or physical safety of the patient or another).

## 2019-08-13 NOTE — Patient Instructions (Signed)
Gastroesophageal Reflux Disease, Adult Gastroesophageal reflux (GER) happens when acid from the stomach flows up into the tube that connects the mouth and the stomach (esophagus). Normally, food travels down the esophagus and stays in the stomach to be digested. With GER, food and stomach acid sometimes move back up into the esophagus. You may have a disease called gastroesophageal reflux disease (GERD) if the reflux:  Happens often.  Causes frequent or very bad symptoms.  Causes problems such as damage to the esophagus. When this happens, the esophagus becomes sore and swollen (inflamed). Over time, GERD can make small holes (ulcers) in the lining of the esophagus. What are the causes? This condition is caused by a problem with the muscle between the esophagus and the stomach. When this muscle is weak or not normal, it does not close properly to keep food and acid from coming back up from the stomach. The muscle can be weak because of:  Tobacco use.  Pregnancy.  Having a certain type of hernia (hiatal hernia).  Alcohol use.  Certain foods and drinks, such as coffee, chocolate, onions, and peppermint. What increases the risk? You are more likely to develop this condition if you:  Are overweight.  Have a disease that affects your connective tissue.  Use NSAID medicines. What are the signs or symptoms? Symptoms of this condition include:  Heartburn.  Difficult or painful swallowing.  The feeling of having a lump in the throat.  A bitter taste in the mouth.  Bad breath.  Having a lot of saliva.  Having an upset or bloated stomach.  Belching.  Chest pain. Different conditions can cause chest pain. Make sure you see your doctor if you have chest pain.  Shortness of breath or noisy breathing (wheezing).  Ongoing (chronic) cough or a cough at night.  Wearing away of the surface of teeth (tooth enamel).  Weight loss. How is this treated? Treatment will depend on how  bad your symptoms are. Your doctor may suggest:  Changes to your diet.  Medicine.  Surgery. Follow these instructions at home: Eating and drinking   Follow a diet as told by your doctor. You may need to avoid foods and drinks such as: ? Coffee and tea (with or without caffeine). ? Drinks that contain alcohol. ? Energy drinks and sports drinks. ? Bubbly (carbonated) drinks or sodas. ? Chocolate and cocoa. ? Peppermint and mint flavorings. ? Garlic and onions. ? Horseradish. ? Spicy and acidic foods. These include peppers, chili powder, curry powder, vinegar, hot sauces, and BBQ sauce. ? Citrus fruit juices and citrus fruits, such as oranges, lemons, and limes. ? Tomato-based foods. These include red sauce, chili, salsa, and pizza with red sauce. ? Fried and fatty foods. These include donuts, french fries, potato chips, and high-fat dressings. ? High-fat meats. These include hot dogs, rib eye steak, sausage, ham, and bacon. ? High-fat dairy items, such as whole milk, butter, and cream cheese.  Eat small meals often. Avoid eating large meals.  Avoid drinking large amounts of liquid with your meals.  Avoid eating meals during the 2-3 hours before bedtime.  Avoid lying down right after you eat.  Do not exercise right after you eat. Lifestyle   Do not use any products that contain nicotine or tobacco. These include cigarettes, e-cigarettes, and chewing tobacco. If you need help quitting, ask your doctor.  Try to lower your stress. If you need help doing this, ask your doctor.  If you are overweight, lose an amount   of weight that is healthy for you. Ask your doctor about a safe weight loss goal. General instructions  Pay attention to any changes in your symptoms.  Take over-the-counter and prescription medicines only as told by your doctor. Do not take aspirin, ibuprofen, or other NSAIDs unless your doctor says it is okay.  Wear loose clothes. Do not wear anything tight  around your waist.  Raise (elevate) the head of your bed about 6 inches (15 cm).  Avoid bending over if this makes your symptoms worse.  Keep all follow-up visits as told by your doctor. This is important. Contact a doctor if:  You have new symptoms.  You lose weight and you do not know why.  You have trouble swallowing or it hurts to swallow.  You have wheezing or a cough that keeps happening.  Your symptoms do not get better with treatment.  You have a hoarse voice. Get help right away if:  You have pain in your arms, neck, jaw, teeth, or back.  You feel sweaty, dizzy, or light-headed.  You have chest pain or shortness of breath.  You throw up (vomit) and your throw-up looks like blood or coffee grounds.  You pass out (faint).  Your poop (stool) is bloody or black.  You cannot swallow, drink, or eat. Summary  If a person has gastroesophageal reflux disease (GERD), food and stomach acid move back up into the esophagus and cause symptoms or problems such as damage to the esophagus.  Treatment will depend on how bad your symptoms are.  Follow a diet as told by your doctor.  Take all medicines only as told by your doctor. This information is not intended to replace advice given to you by your health care provider. Make sure you discuss any questions you have with your health care provider. Document Released: 02/06/2008 Document Revised: 02/26/2018 Document Reviewed: 02/26/2018 Elsevier Patient Education  2020 [employer] for Gastroesophageal Reflux Disease, Adult When you have gastroesophageal reflux disease (GERD), the foods you eat and your eating habits are very important. Choosing the right foods can help ease your discomfort. Think about working with a nutrition specialist (dietitian) to help you make good choices. What are tips for following this plan?  Meals  Choose healthy foods that are low in fat, such as fruits, vegetables, whole grains,  low-fat dairy products, and lean meat, fish, and poultry.  Eat small meals often instead of 3 large meals a day. Eat your meals slowly, and in a place where you are relaxed. Avoid bending over or lying down until 2-3 hours after eating.  Avoid eating meals 2-3 hours before bed.  Avoid drinking a lot of liquid with meals.  Cook foods using methods other than frying. Bake, grill, or broil food instead.  Avoid or limit: ? Chocolate. ? Peppermint or spearmint. ? Alcohol. ? Pepper. ? Black and decaffeinated coffee. ? Black and decaffeinated tea. ? Bubbly (carbonated) soft drinks. ? Caffeinated energy drinks and soft drinks.  Limit high-fat foods such as: ? Fatty meat or fried foods. ? Whole milk, cream, butter, or ice cream. ? Nuts and nut butters. ? Pastries, donuts, and sweets made with butter or shortening.  Avoid foods that cause symptoms. These foods may be different for everyone. Common foods that cause symptoms include: ? Tomatoes. ? Oranges, lemons, and limes. ? Peppers. ? Spicy food. ? Onions and garlic. ? Vinegar. Lifestyle  Maintain a healthy weight. Ask your doctor what weight is healthy for  you. If you need to lose weight, work with your doctor to do so safely.  Exercise for at least 30 minutes for 5 or more days each week, or as told by your doctor.  Wear loose-fitting clothes.  Do not smoke. If you need help quitting, ask your doctor.  Sleep with the head of your bed higher than your feet. Use a wedge under the mattress or blocks under the bed frame to raise the head of the bed. Summary  When you have gastroesophageal reflux disease (GERD), food and lifestyle choices are very important in easing your symptoms.  Eat small meals often instead of 3 large meals a day. Eat your meals slowly, and in a place where you are relaxed.  Limit high-fat foods such as fatty meat or fried foods.  Avoid bending over or lying down until 2-3 hours after eating.  Avoid  peppermint and spearmint, caffeine, alcohol, and chocolate. This information is not intended to replace advice given to you by your health care provider. Make sure you discuss any questions you have with your health care provider. Document Released: 02/19/2012 Document Revised: 12/11/2018 Document Reviewed: 09/25/2016 Elsevier Patient Education  2020 [employer].  Hiatal Hernia  A hiatal hernia occurs when part of the stomach slides above the muscle that separates the abdomen from the chest (diaphragm). A person can be born with a hiatal hernia (congenital), or it may develop over time. In almost all cases of hiatal hernia, only the top part of the stomach pushes through the diaphragm. Many people have a hiatal hernia with no symptoms. The larger the hernia, the more likely it is that you will have symptoms. In some cases, a hiatal hernia allows stomach acid to flow back into the tube that carries food from your mouth to your stomach (esophagus). This may cause heartburn symptoms. Severe heartburn symptoms may mean that you have developed a condition called gastroesophageal reflux disease (GERD). What are the causes? This condition is caused by a weakness in the opening (hiatus) where the esophagus passes through the diaphragm to attach to the upper part of the stomach. A person may be born with a weakness in the hiatus, or a weakness can develop over time. What increases the risk? This condition is more likely to develop in:  Older people. Age is a major risk factor for a hiatal hernia, especially if you are over the age of 37.  Pregnant women.  People who are overweight.  People who have frequent constipation. What are the signs or symptoms? Symptoms of this condition usually develop in the form of GERD symptoms. Symptoms include:  Heartburn.  Belching.  Indigestion.  Trouble swallowing.  Coughing or wheezing.  Sore throat.  Hoarseness.  Chest pain.  Nausea and vomiting.  How is this diagnosed? This condition may be diagnosed during testing for GERD. Tests that may be done include:  X-rays of your stomach or chest.  An upper gastrointestinal (GI) series. This is an X-ray exam of your GI tract that is taken after you swallow a chalky liquid that shows up clearly on the X-ray.  Endoscopy. This is a procedure to look into your stomach using a thin, flexible tube that has a tiny camera and light on the end of it. How is this treated? This condition may be treated by:  Dietary and lifestyle changes to help reduce GERD symptoms.  Medicines. These may include: ? Over-the-counter antacids. ? Medicines that make your stomach empty more quickly. ? Medicines that  block the production of stomach acid (H2 blockers). ? Stronger medicines to reduce stomach acid (proton pump inhibitors).  Surgery to repair the hernia, if other treatments are not helping. If you have no symptoms, you may not need treatment. Follow these instructions at home: Lifestyle and activity  Do not use any products that contain nicotine or tobacco, such as cigarettes and e-cigarettes. If you need help quitting, ask your health care provider.  Try to achieve and maintain a healthy body weight.  Avoid putting pressure on your abdomen. Anything that puts pressure on your abdomen increases the amount of acid that may be pushed up into your esophagus. ? Avoid bending over, especially after eating. ? Raise the head of your bed by putting blocks under the legs. This keeps your head and esophagus higher than your stomach. ? Do not wear tight clothing around your chest or stomach. ? Try not to strain when having a bowel movement, when urinating, or when lifting heavy objects. Eating and drinking  Avoid foods that can worsen GERD symptoms. These may include: ? Fatty foods, like fried foods. ? Citrus fruits, like oranges or lemon. ? Other foods and drinks that contain acid, like orange juice or  tomatoes. ? Spicy food. ? Chocolate.  Eat frequent small meals instead of three large meals a day. This helps prevent your stomach from getting too full. ? Eat slowly. ? Do not lie down right after eating. ? Do not eat 1-2 hours before bed.  Do not drink beverages with caffeine. These include cola, coffee, cocoa, and tea.  Do not drink alcohol. General instructions  Take over-the-counter and prescription medicines only as told by your health care provider.  Keep all follow-up visits as told by your health care provider. This is important. Contact a health care provider if:  Your symptoms are not controlled with medicines or lifestyle changes.  You are having trouble swallowing.  You have coughing or wheezing that will not go away. Get help right away if:  Your pain is getting worse.  Your pain spreads to your arms, neck, jaw, teeth, or back.  You have shortness of breath.  You sweat for no reason.  You feel sick to your stomach (nauseous) or you vomit.  You vomit blood.  You have bright red blood in your stools.  You have black, tarry stools. This information is not intended to replace advice given to you by your health care provider. Make sure you discuss any questions you have with your health care provider. Document Released: 11/10/2003 Document Revised: 08/02/2017 Document Reviewed: 03/25/2017 Elsevier Patient Education  2020 [employer].

## 2019-08-25 DIAGNOSIS — Z309 Encounter for contraceptive management, unspecified: Secondary | ICD-10-CM | POA: Diagnosis not present

## 2019-09-16 ENCOUNTER — Encounter: Payer: Self-pay | Admitting: Gastroenterology

## 2019-09-16 ENCOUNTER — Ambulatory Visit: Payer: Federal, State, Local not specified - PPO | Admitting: Gastroenterology

## 2019-09-16 VITALS — BP 112/78 | HR 72 | Temp 98.4°F | Ht 61.0 in | Wt 164.0 lb

## 2019-09-16 DIAGNOSIS — Z87898 Personal history of other specified conditions: Secondary | ICD-10-CM

## 2019-09-16 DIAGNOSIS — R1319 Other dysphagia: Secondary | ICD-10-CM

## 2019-09-16 DIAGNOSIS — R1310 Dysphagia, unspecified: Secondary | ICD-10-CM | POA: Diagnosis not present

## 2019-09-16 DIAGNOSIS — K30 Functional dyspepsia: Secondary | ICD-10-CM

## 2019-09-16 DIAGNOSIS — Z01818 Encounter for other preprocedural examination: Secondary | ICD-10-CM

## 2019-09-16 DIAGNOSIS — K219 Gastro-esophageal reflux disease without esophagitis: Secondary | ICD-10-CM | POA: Diagnosis not present

## 2019-09-16 NOTE — Patient Instructions (Addendum)
You have been scheduled for an endoscopy. Please follow written instructions given to you at your visit today. If you use inhalers (even only as needed), please bring them with you on the day of your procedure.   If you are age 45 or younger, your body mass index should be between 19-25. Your Body mass index is 30.99 kg/m. If this is out of the aformentioned range listed, please consider follow up with your Primary Care Provider.   Due to recent changes in healthcare laws, you may see the results of your imaging and laboratory studies on MyChart before your provider has had a chance to review them.  We understand that in some cases there may be results that are confusing or concerning to you. Not all laboratory results come back in the same time frame and the provider may be waiting for multiple results in order to interpret others.  Please give [place] 48 hours in order for your provider to thoroughly review all the results before contacting the office for clarification of your results.     Thank you for choosing me and [facility].  Dr. Rush Landmark

## 2019-09-16 NOTE — Progress Notes (Signed)
[place] VISIT   Primary Care Provider Billie Ruddy, MD [place] [address] [phone]  Referring Provider Billie Ruddy, MD [address] [place],  [place] [address] [phone]  Patient Profile: Patricia Barron is a 44 y.o. female with a pmh significant for asthma, GERD.  The patient presents to the [facility] for an evaluation and management of problem(s) noted below:  Problem List 1. Gastroesophageal reflux disease, unspecified whether esophagitis present   2. Esophageal dysphagia   3. History of odynophagia   4. Indigestion   5. Encounter for pre-operative examination     History of Present Illness This is the patient's first visit to the outpatient [facility] clinic.  She has experienced symptoms of heartburn/pyrosis for more than 20 years.  In the past symptoms were mostly based on when she had dietary indiscretions.  If she took Tums she would have improvement and did not necessarily need to have anything stronger.  Within a day or so they can subside and she would do well for days to weeks at a time.  She does remember that after her third child was born things seem to have been more recurrent after that time.  The patient describes a burning sensation and discomfort in the mid chest and esophageal region.  She became significantly concerned when she began to experience more frequent solid food dysphagia as well as odynophagia we even with liquids.  The symptoms have been ongoing for at least the last 2-1/2 to 3 months.  In November she was initiated on 20 mg twice daily PPI.  By being on 20 mg twice daily PPI she has had an improvement in many of her symptoms and has not had overt dysphagia but continues to have concerns about the reason and etiology of her symptoms.  Occasionally the patient will have indigestion but the increased PPI dosing has been helpful.  She has normal bowel  movements on a daily basis that are brown without any other significant changes or blood in the stool.  She has no abdominal pain.  She has no nausea or vomiting at this time.  She has a maternal grandmother who has a history of heartburn but no other significant GI malignancies in her family history.  The patient does not take NSAIDs on a regular basis.  She has never had an upper or lower endoscopy.  GI Review of Systems Positive as above Negative for current odynophagia, globus, nausea, vomiting, hematemesis, coffee-ground emesis, decreased appetite, early satiety, change in bowel habits  Review of Systems General: Denies fevers/chills/weight loss unintentionally HEENT: Denies oral lesions Cardiovascular: Denies chest pain/palpitations Pulmonary: Denies shortness of breath/nocturnal cough Gastroenterological: See HPI Genitourinary: Denies darkened urine Hematological: Denies easy bruising/bleeding Endocrine: Denies temperature intolerance Dermatological: Denies jaundice Psychological: Mood is anxious to find an answer to her symptoms   Medications Current Outpatient Medications  Medication Sig Dispense Refill  . albuterol (PROVENTIL HFA;VENTOLIN HFA) 108 (90 Base) MCG/ACT inhaler Inhale 2 puffs into the lungs every 4 (four) hours as needed for wheezing or shortness of breath. 1 Inhaler 1  . Multiple Vitamins-Calcium (ONE-A-DAY WOMENS FORMULA PO) Take 1 tablet by mouth daily.    Marland Kitchen omeprazole (PRILOSEC) 20 MG capsule Take 1 capsule (20 mg total) by mouth 2 (two) times daily before a meal. 60 capsule 3   No current facility-administered medications for this visit.    Allergies No Known Allergies  Histories Past Medical History:  Diagnosis  Date  . Asthma   . GERD (gastroesophageal reflux disease)    Past Surgical History:  Procedure Laterality Date  . none     Social History   Socioeconomic History  . Marital status: Single    Spouse name: Not on file  . Number of  children: 4  . Years of education: Not on file  . Highest education level: Not on file  Occupational History  . Occupation: customer service  Tobacco Use  . Smoking status: Never Smoker  . Smokeless tobacco: Never Used  Substance and Sexual Activity  . Alcohol use: No  . Drug use: No  . Sexual activity: Never    Birth control/protection: None  Other Topics Concern  . 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 Difficulty of Paying Living Expenses: Not on file  Food Insecurity:   . Worried About Charity fundraiser in the Last Year: Not on file  . Ran Out of Food in the Last Year: Not on file  Transportation Needs:   . Lack of Transportation (Medical): Not on file  . Lack of Transportation (Non-Medical): Not on file  Physical Activity:   . Days of Exercise per Week: Not on file  . Minutes of Exercise per Session: Not on file  Stress:   . Feeling of Stress : Not on file  Social Connections:   . Frequency of Communication with Friends and Family: Not on file  . Frequency of Social Gatherings with Friends and Family: Not on file  . Attends Religious Services: Not on file  . Active Member of Clubs or Organizations: Not on file  . Attends Archivist Meetings: Not on file  . Marital Status: Not on file  Intimate Partner Violence:   . Fear of Current or Ex-Partner: Not on file  . Emotionally Abused: Not on file  . Physically Abused: Not on file  . Sexually Abused: Not on file   Family History  Problem Relation Age of Onset  . Diabetes Mother   . Hypertension Mother   . Hyperlipidemia Father   . Hypertension Father   . Diabetes Maternal Grandmother   . Kidney failure Maternal Grandmother   . GER disease Maternal Grandmother   . Colon cancer Neg Hx   . Liver cancer Neg Hx   . Stomach cancer Neg Hx   . Rectal cancer Neg Hx   . Esophageal cancer Neg  Hx   . Inflammatory bowel disease Neg Hx   . Pancreatic cancer Neg Hx    I have reviewed her medical, social, and family history in detail and updated the electronic medical record as necessary.    PHYSICAL EXAMINATION  BP 112/78   Pulse 72   Temp 98.4 F (36.9 C)   Ht 5\' 1"  (1.549 m)   Wt 164 lb (74.4 kg)   BMI 30.99 kg/m  Wt Readings from Last 3 Encounters:  09/16/19 164 lb (74.4 kg)  08/13/19 164 lb (74.4 kg)  05/06/19 162 lb (73.5 kg)  GEN: NAD, appears stated age, doesn't appear chronically ill PSYCH: Cooperative, without pressured speech EYE: Conjunctivae pink, sclerae anicteric ENT: MMM, without oral ulcers, no erythema or exudates noted NECK: Supple CV: RR without R/Gs  RESP: CTAB posteriorly, without wheezing GI: NABS, soft, NT/ND, without rebound or guarding, no HSM appreciated MSK/EXT:  No lower extremity edema SKIN: No jaundice NEURO:  Alert & Oriented x 3, no focal deficits   REVIEW OF DATA  I reviewed the following data at the time of this encounter:  GI Procedures and Studies  No previous GI procedures to review  Laboratory Studies  Reviewed those in epic  Imaging Studies  No relevant studies to review   ASSESSMENT  Ms. Templet is a 44 y.o. female with a pmh significant for asthma, GERD.  The patient is seen today for evaluation and management of:  1. Gastroesophageal reflux disease, unspecified whether esophagitis present   2. Esophageal dysphagia   3. History of odynophagia   4. Indigestion   5. Encounter for pre-operative examination    The patient is hemodynamically stable.  From a clinical perspective, on her increase PPI dosing, she seems to be doing better but is not perfect yet.  Based on the significance of her symptoms within the last few months, she deserves an endoscopic evaluation to rule out esophagitis, EOE, elderly, as well as H. pylori gastritis.  I suspect we will end up finding some sort of esophagitis or stenosis and will  require potential dilation.  I will maintain her on her dosing of 20 mg twice daily PPI but will consider increased dosing based on the findings at time of endoscopy.  We briefly discussed today, that there are other modalities of treatment for heartburn including surgery as well as other novel devices but that is something we can consider down the road.  The risks and benefits of endoscopic evaluation were discussed with the patient; these include but are not limited to the risk of perforation, infection, bleeding, missed lesions, lack of diagnosis, severe illness requiring hospitalization, as well as anesthesia and sedation related illnesses.  The patient is agreeable to proceed.  All patient questions were answered, to the best of my ability, and the patient agrees to the aforementioned plan of action with follow-up as indicated.   PLAN  Proceed with diagnostic endoscopy and possible dilation (esophageal/gastric and possible duodenal biopsies) Continue PPI 20 mg twice daily for now Further work-up/management to be dictated by results of endoscopy  Orders Placed This Encounter  Procedures  . SARS Coronavirus 2 ([facility] ONLY)  . Ambulatory referral to Gastroenterology    New Prescriptions   No medications on file   Modified Medications   No medications on file    Planned Follow Up No follow-ups on file.   Total Time in Face-to-Face and in Coordination of Care for patient including review/personal interpretation of prior testing, medical history, examination, medication adjustment, documentation with the EHR is 45 minutes   Justice Britain, MD [facility] Gastroenterology Advanced Endoscopy Office # [phone]

## 2019-09-17 ENCOUNTER — Encounter: Payer: Self-pay | Admitting: Gastroenterology

## 2019-09-25 ENCOUNTER — Other Ambulatory Visit: Payer: Self-pay | Admitting: Gastroenterology

## 2019-09-25 ENCOUNTER — Ambulatory Visit (INDEPENDENT_AMBULATORY_CARE_PROVIDER_SITE_OTHER): Payer: Federal, State, Local not specified - PPO

## 2019-09-25 DIAGNOSIS — Z1159 Encounter for screening for other viral diseases: Secondary | ICD-10-CM | POA: Diagnosis not present

## 2019-09-28 LAB — SARS CORONAVIRUS 2 (TAT 6-24 HRS): SARS Coronavirus 2: NEGATIVE

## 2019-09-29 ENCOUNTER — Other Ambulatory Visit: Payer: Self-pay

## 2019-09-29 ENCOUNTER — Ambulatory Visit (AMBULATORY_SURGERY_CENTER): Payer: Federal, State, Local not specified - PPO | Admitting: Gastroenterology

## 2019-09-29 ENCOUNTER — Encounter: Payer: Self-pay | Admitting: Gastroenterology

## 2019-09-29 VITALS — BP 116/73 | HR 94 | Temp 97.1°F | Resp 17 | Ht 61.0 in | Wt 164.0 lb

## 2019-09-29 DIAGNOSIS — K2950 Unspecified chronic gastritis without bleeding: Secondary | ICD-10-CM | POA: Diagnosis not present

## 2019-09-29 DIAGNOSIS — K219 Gastro-esophageal reflux disease without esophagitis: Secondary | ICD-10-CM | POA: Diagnosis not present

## 2019-09-29 DIAGNOSIS — B3781 Candidal esophagitis: Secondary | ICD-10-CM | POA: Diagnosis not present

## 2019-09-29 DIAGNOSIS — B9681 Helicobacter pylori [H. pylori] as the cause of diseases classified elsewhere: Secondary | ICD-10-CM | POA: Diagnosis not present

## 2019-09-29 DIAGNOSIS — K449 Diaphragmatic hernia without obstruction or gangrene: Secondary | ICD-10-CM

## 2019-09-29 DIAGNOSIS — R1310 Dysphagia, unspecified: Secondary | ICD-10-CM

## 2019-09-29 DIAGNOSIS — K2980 Duodenitis without bleeding: Secondary | ICD-10-CM

## 2019-09-29 DIAGNOSIS — K3189 Other diseases of stomach and duodenum: Secondary | ICD-10-CM

## 2019-09-29 MED ORDER — SODIUM CHLORIDE 0.9 % IV SOLN: 500.0000 mL | Freq: Once | INTRAVENOUS | Status: DC

## 2019-09-29 MED ORDER — OMEPRAZOLE 20 MG PO CPDR: 40.0000 mg | DELAYED_RELEASE_CAPSULE | Freq: Two times a day (BID) | ORAL | 3 refills | Status: DC

## 2019-09-29 NOTE — Progress Notes (Signed)
Temp-JB VS-DT 

## 2019-09-29 NOTE — Patient Instructions (Signed)
**Full Liquid diet today**  Handouts on hiatal hernia and gastritis given to you today  Await pathology results cepacol lozenges or chloraseptic to help with sore throat  Follow up Feb 23rd at 2:00 with Dr. Dionicio Stall  YOU HAD AN ENDOSCOPIC PROCEDURE TODAY AT THE [facility]:   Refer to the procedure report that was given to you for any specific questions about what was found during the examination.  If the procedure report does not answer your questions, please call your gastroenterologist to clarify.  If you requested that your care partner not be given the details of your procedure findings, then the procedure report has been included in a sealed envelope for you to review at your convenience later.  YOU SHOULD EXPECT: Some feelings of bloating in the abdomen. Passage of more gas than usual.  Walking can help get rid of the air that was put into your GI tract during the procedure and reduce the bloating. If you had a lower endoscopy (such as a colonoscopy or flexible sigmoidoscopy) you may notice spotting of blood in your stool or on the toilet paper. If you underwent a bowel prep for your procedure, you may not have a normal bowel movement for a few days.  Please Note:  You might notice some irritation and congestion in your nose or some drainage.  This is from the oxygen used during your procedure.  There is no need for concern and it should clear up in a day or so.  SYMPTOMS TO REPORT IMMEDIATELY:   Following upper endoscopy (EGD)  Vomiting of blood or coffee ground material  New chest pain or pain under the shoulder blades  Painful or persistently difficult swallowing  New shortness of breath  Fever of 100F or higher  Black, tarry-looking stools  For urgent or emergent issues, a gastroenterologist can be reached at any hour by calling [phone].   DIET:  We do recommend a small meal at first, but then you may proceed to your regular diet.  Drink plenty of fluids but  you should avoid alcoholic beverages for 24 hours.  ACTIVITY:  You should plan to take it easy for the rest of today and you should NOT DRIVE or use heavy machinery until tomorrow (because of the sedation medicines used during the test).    FOLLOW UP: Our staff will call the number listed on your records 48-72 hours following your procedure to check on you and address any questions or concerns that you may have regarding the information given to you following your procedure. If we do not reach you, we will leave a message.  We will attempt to reach you two times.  During this call, we will ask if you have developed any symptoms of COVID 19. If you develop any symptoms (ie: fever, flu-like symptoms, shortness of breath, cough etc.) before then, please call [phone].  If you test positive for Covid 19 in the 2 weeks post procedure, please call and report this information to [place].    If any biopsies were taken you will be contacted by phone or by letter within the next 1-3 weeks.  Please call us at [phone] if you have not heard about the biopsies in 3 weeks.    SIGNATURES/CONFIDENTIALITY: You and/or your care partner have signed paperwork which will be entered into your electronic medical record.  These signatures attest to the fact that that the information above on your After Visit Summary has been reviewed and is understood.  Full responsibility of the confidentiality of this discharge information lies with you and/or your care-partner.

## 2019-09-29 NOTE — Progress Notes (Signed)
To PACU, VSS. Report to [email or web address] 

## 2019-09-29 NOTE — Progress Notes (Signed)
Called to room to assist during endoscopic procedure.  Patient ID and intended procedure confirmed with present staff. Received instructions for my participation in the procedure from the performing physician.  

## 2019-09-29 NOTE — Op Note (Signed)
[facility] Patient Name: Patricia Barron Procedure Date: 09/29/2019 8:20 AM MRN: [id number] Endoscopist: Justice Britain , MD Age: 44 Referring MD:  Date of Birth: 03-15-1976 Gender: Female Account #: [id number] Procedure:                Upper GI endoscopy Indications:              Dysphagia, Heartburn, Gastro-esophageal reflux                            disease, Dyspepsia Medicines:                Monitored Anesthesia Care Procedure:                Pre-Anesthesia Assessment:                           - Prior to the procedure, a History and Physical                            was performed, and patient medications and                            allergies were reviewed. The patient's tolerance of                            previous anesthesia was also reviewed. The risks                            and benefits of the procedure and the sedation                            options and risks were discussed with the patient.                            All questions were answered, and informed consent                            was obtained. Prior Anticoagulants: The patient has                            taken no previous anticoagulant or antiplatelet                            agents. ASA Grade Assessment: II - A patient with                            mild systemic disease. After reviewing the risks                            and benefits, the patient was deemed in                            satisfactory condition to undergo the procedure.  After obtaining informed consent, the endoscope was                            passed under direct vision. Throughout the                            procedure, the patient's blood pressure, pulse, and                            oxygen saturations were monitored continuously. The                            Endoscope was introduced through the mouth, and                            advanced to the second part of  duodenum. The upper                            GI endoscopy was accomplished without difficulty.                            The patient tolerated the procedure. Scope In: Scope Out: Findings:                 No gross lesions were noted in the proximal                            esophagus.                           White nummular lesions were noted in the mid                            esophagus - query Candida.                           No gross lesions were noted in the distal esophagus.                           Biopsies were taken with a cold forceps in the                            entire esophagus for histology for EoE/LoE/Candida.                            After the rest of the EGD was completed, a                            guidewire was placed and the scope was withdrawn.                            Dilation was performed in the entire esophagus with  a Savary dilator with no resistance at 16 mm and 17                            mm and mild resistance at 18 mm. The dilation site                            was examined following endoscope reinsertion and                            showed moderate mucosal disruption and no                            perforation.                           A small hiatal hernia was found. The proximal                            extent of the gastric folds (end of tubular                            esophagus) was 33 cm from the incisors. The hiatal                            narrowing was 35 cm from the incisors. The Z-line                            was 33 cm from the incisors.                           Patchy mildly erythematous mucosa without bleeding                            was found in the gastric antrum.                           No other gross lesions were noted in the entire                            examined stomach. Biopsies were taken with a cold                            forceps for histology and  Helicobacter pylori                            testing.                           No gross lesions were noted in the duodenal bulb,                            in the first portion of the duodenum and in the  second portion of the duodenum. Biopsies for                            histology were taken with a cold forceps for                            evaluation of celiac disease/enteropathy rule out. Complications:            No immediate complications. Estimated Blood Loss:     Estimated blood loss was minimal. Impression:               - No gross lesions in esophagus                            proximally/distally. White nummular lesions in                            esophageal mucosa in the middle of esophagus.                            Biopsied for EoE/LoE/Candida.                           - Small hiatal hernia.                           - Erythematous mucosa in the antrum. No other gross                            lesions in the stomach. Biopsied.                           - No gross lesions in the duodenal bulb, in the                            first portion of the duodenum and in the second                            portion of the duodenum. Biopsied. Recommendation:           - The patient will be observed post-procedure,                            until all discharge criteria are met.                           - Discharge patient to home.                           - Patient has a contact number available for                            emergencies. The signs and symptoms of potential                            delayed complications were  discussed with the                            patient. Return to normal activities tomorrow.                            Written discharge instructions were provided to the                            patient.                           - Full liquid diet today.                           - Increase PPI to 40 mg twice daily  for 1 month.                            Then may decrease back to 40 mg daily (either 20 mg                            twice daily or 40 mg once daily).                           - Observe patient's clinical course.                           - Cepacol lozenges or Chloraseptic for the next 72                            hours to help with sore throat.                           - Return to GI clinic in 6 weeks.                           - If patient's dysphagia persists, then would                            consider esophageal manometry for further                            workup/management.                           - The findings and recommendations were discussed                            with the patient. Justice Britain, MD 09/29/2019 9:06:39 AM

## 2019-10-01 ENCOUNTER — Telehealth: Payer: Self-pay

## 2019-10-01 NOTE — Telephone Encounter (Signed)
  Follow up Call-  Call back number 09/29/2019  Post procedure Call Back phone  # [phone]  Permission to leave phone message Yes  Some recent data might be hidden     Patient questions:  Do you have a fever, pain , or abdominal swelling? No. Pain Score  0 *  Have you tolerated food without any problems? Yes.    Have you been able to return to your normal activities? Yes.    Do you have any questions about your discharge instructions: Diet   No. Medications  No. Follow up visit  No.  Do you have questions or concerns about your Care? No.  Actions: * If pain score is 4 or above: No action needed, pain <4.   1. Have you developed a fever since your procedure? No  2.   Have you had an respiratory symptoms (SOB or cough) since your procedure? No  3.   Have you tested positive for COVID 19 since your procedure No  4.   Have you had any family members/close contacts diagnosed with the COVID 19 since your procedure?  No   If yes to any of these questions please route to Joylene John, RN and Alphonsa Gin, RN.

## 2019-10-02 ENCOUNTER — Other Ambulatory Visit: Payer: Self-pay

## 2019-10-02 ENCOUNTER — Encounter: Payer: Self-pay | Admitting: Gastroenterology

## 2019-10-02 MED ORDER — FLUCONAZOLE 200 MG PO TABS: 200.0000 mg | ORAL_TABLET | Freq: Every day | ORAL | 0 refills | Status: DC

## 2019-10-05 ENCOUNTER — Other Ambulatory Visit: Payer: Self-pay

## 2019-10-05 MED ORDER — PYLERA 140-125-125 MG PO CAPS: 3.0000 | ORAL_CAPSULE | Freq: Three times a day (TID) | ORAL | 0 refills | Status: DC

## 2019-10-07 ENCOUNTER — Telehealth: Payer: Self-pay | Admitting: Gastroenterology

## 2019-10-07 NOTE — Telephone Encounter (Signed)
The pt was advised that a letter was mailed to her home.  She asked that I send another copy to her My Chart which I did as requested.  The pt thanked me for calling.

## 2019-10-07 NOTE — Telephone Encounter (Signed)
Patient is calling to follow up on results from the doctor upon looking into chart he states letter with results and recommendations to follow.

## 2019-10-07 NOTE — Telephone Encounter (Signed)
Left message on machine to call back     DECIMA WIGAND [address] [place]. [place] [address]   Dear Patricia Barron  I am writing to inform you that the biopsies taken during your recent endoscopic examination showed:   Diagnosis 1. 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 - WARTHIN-STARRY IS POSITIVE FOR HELICOBACTER PYLORI. - NO DYSPLASIA OR MALIGNANCY. 2. Surgical [P], esophageal - CANDIDA ESOPHAGITIS. - PAS IS POSITIVE FOR FUNGAL ORGANISMS. - NO INTESTINAL METAPLASIA, DYSPLASIA, OR MALIGNANCY. 3. Surgical [P], duodenal - PEPTIC DUODENITIS. - NO DYSPLASIA OR MALIGNANCY.   What this is all mean? Your stomach biopsies showed evidence of active inflammation and did show Helicobacter pylori.  This is a superficial infection of the lining of the stomach that can cause ulcers and pain to develop in a patient.  You will need antibiotic treatment in addition to your acid reducing medications which we will discuss with you.  After your have been treated with antibiotics I want you to stay on acid reducing medications for 1 month.  You will then stop your acid medication and have stool study to check if the H. Pylori has been eradicated.  We will go over this with you in further detail over the phone. You also have evidence of intestinal metaplasia.  Intestinal metaplasia is when the stomach lining begins to look like intestine lining.  In a small proportion of patients this can be a risk factor for gastric cancer in the future.  We will plan to repeat an endoscopy in 3 to 4 months, after you have completed your H. pylori treatment, so that we can see if you have any further evidence of this particular change. Your small intestine biopsies showed evidence of inflammation known as duodenitis but no precancerous changes. Your esophagus biopsies showed evidence of Candida esophagitis, a superficial infection.   This will need antifungal treatment for 2 weeks.  We will go over this with you in further detail over the phone. We will work to arrange this clinic visit moving forward as well in approximately 6 weeks.   Please call us at Dept: [phone] if you have persistent problems or have questions about your condition that have not been fully answered at this time.  Sincerely,  Irving Copas., MD

## 2019-10-12 ENCOUNTER — Other Ambulatory Visit: Payer: Self-pay

## 2019-10-12 ENCOUNTER — Ambulatory Visit (HOSPITAL_COMMUNITY)
Admission: EM | Admit: 2019-10-12 | Discharge: 2019-10-12 | Disposition: A | Discharge status: Home / Self Care | Payer: Federal, State, Local not specified - PPO | Attending: Physician Assistant | Admitting: Physician Assistant

## 2019-10-12 ENCOUNTER — Encounter (HOSPITAL_COMMUNITY): Payer: Self-pay

## 2019-10-12 DIAGNOSIS — N939 Abnormal uterine and vaginal bleeding, unspecified: Secondary | ICD-10-CM

## 2019-10-12 DIAGNOSIS — N9489 Other specified conditions associated with female genital organs and menstrual cycle: Secondary | ICD-10-CM | POA: Diagnosis not present

## 2019-10-12 LAB — POCT URINALYSIS DIP (DEVICE)
Glucose, UA: NEGATIVE mg/dL
Leukocytes,Ua: NEGATIVE
Nitrite: NEGATIVE
Protein, ur: 30 mg/dL — AB
Specific Gravity, Urine: >=1.03 (ref 1.005–1.030)
Urobilinogen, UA: 0.2 mg/dL (ref 0.0–1.0)
pH: 6 (ref 5.0–8.0)

## 2019-10-12 MED ORDER — ACETAMINOPHEN 500 MG PO TABS: 500.0000 mg | ORAL_TABLET | Freq: Four times a day (QID) | ORAL | 0 refills | Status: DC | PRN

## 2019-10-12 NOTE — ED Triage Notes (Signed)
Patient presents to Urgent Care with complaints of lower abdominal pain since 4 days ago. Patient reports she took omeprazole and diflucan together and thinks that may have caused the pain, also had some vaginal bleeding but this is abnormal for her because she is on the depo injection.

## 2019-10-12 NOTE — ED Provider Notes (Signed)
[place]    CSN: [id number] Arrival date & time: 10/12/19  [id number]      History   Chief Complaint Chief Complaint  Patient presents with  . Abdominal Pain    HPI Patricia Barron is a 44 y.o. female.   Patient reports to urgent care today for lower abdominal pain and vaginal bleeding. Patient reports symptoms started 4 days ago. The pain has been in the middle portion of her lower abdomen. Described as sharp at times but also cramp like. She reports pain increases to a 9/10 at times. She reports initially having a blood clot pass from her vagina when bleeding started and has since had 1 more episode of clot today. She has had mild bleeding, controlled with 1 pad a day. She denies vaginal pain, discharge, itching, painful urination, frequent urination or urgency. Denies fever and chills, diarrhea or vomiting. She does have some nausea. She is currently being treated for esophageal candidiasis, reflux and soon to be h. Pylori, all identified on 09/29/2019 upper endoscopy. She is taking diflucan and omeprazole. She reports receiving her last depot injection for birth control last month. She began this in June of 2020 and has not had a menstrual cycle or bleeding of any kind since that time. She reports regular periods prior to that. She reports her cycles were preceded by breast tenderness at that time but denies those symptoms now.   She was sexually active 1 month ago. Her Gyn care is managed by her primary care physician. She has negative Pap in 05/2019     Past Medical History:  Diagnosis Date  . Asthma   . GERD (gastroesophageal reflux disease)     Patient Active Problem List   Diagnosis Date Noted  . Health care maintenance 11/10/2013  . Personal history of contraception 11/10/2013  . Asthma, mild intermittent 09/10/2013    Past Surgical History:  Procedure Laterality Date  . none      OB History    Gravida  5   Para  4   Term  4   Preterm      AB      Living  4     SAB      TAB      Ectopic      Multiple      Live Births               Home Medications    Prior to Admission medications   Medication Sig Start Date End Date Taking? Authorizing Provider  acetaminophen (TYLENOL) 500 MG tablet Take 1 tablet (500 mg total) by mouth every 6 (six) hours as needed. 10/12/19   Lonny Eisen, Marguerita Beards, PA-C  albuterol (PROVENTIL HFA;VENTOLIN HFA) 108 (90 Base) MCG/ACT inhaler Inhale 2 puffs into the lungs every 4 (four) hours as needed for wheezing or shortness of breath. 08/04/18   Joy, Helane Gunther, PA-C  bismuth-metronidazole-tetracycline (PYLERA) 250 645 [id number] MG capsule Take 3 capsules by mouth 4 (four) times daily -  before meals and at bedtime for 10 days. 10/05/19 10/15/19  Mansouraty, Telford Nab., MD  fluconazole (DIFLUCAN) 200 MG tablet Take 1 tablet (200 mg total) by mouth daily. 400 mg day 1, 200 mg day 2-14 10/02/19   Misty Stanley, MD  medroxyPROGESTERone (DEPO-PROVERA) 150 MG/ML injection Depo-Provera 150 mg/mL intramuscular suspension  Inject 1 mL every 3 months by intramuscular route for 90 days.    [provider]  Multiple Vitamins-Calcium (ONE-A-DAY WOMENS FORMULA PO) Take 1 tablet  by mouth daily.    [provider]  omeprazole (PRILOSEC) 20 MG capsule Take 2 capsules (40 mg total) by mouth 2 (two) times daily before a meal. 09/29/19   Mansouraty, Telford Nab., MD    Family History Family History  Problem Relation Age of Onset  . Diabetes Mother   . Hypertension Mother   . Hyperlipidemia Father   . Hypertension Father   . Diabetes Maternal Grandmother   . Kidney failure Maternal Grandmother   . GER disease Maternal Grandmother   . Colon cancer Neg Hx   . Liver cancer Neg Hx   . Stomach cancer Neg Hx   . Rectal cancer Neg Hx   . Esophageal cancer Neg Hx   . Inflammatory bowel disease Neg Hx   . Pancreatic cancer Neg Hx     Social History Social History   Tobacco Use  . Smoking status: Never Smoker  .  Smokeless tobacco: Never Used  Substance Use Topics  . Alcohol use: No  . Drug use: No     Allergies   Patient has no known allergies.   Review of Systems Review of Systems  Constitutional: Negative for chills, fatigue and fever.  HENT: Negative.   Respiratory: Negative.   Cardiovascular: Negative.   Gastrointestinal: Positive for abdominal pain and nausea. Negative for abdominal distention, anal bleeding, blood in stool, constipation, diarrhea, rectal pain and vomiting.  Genitourinary: Positive for menstrual problem and vaginal bleeding. Negative for decreased urine volume, dyspareunia, dysuria, flank pain, frequency, hematuria, pelvic pain, urgency, vaginal discharge and vaginal pain.  Musculoskeletal: Negative for arthralgias, back pain and myalgias.  Skin: Negative for rash.  Neurological: Negative.   Hematological: Negative.      Physical Exam Triage Vital Signs ED Triage Vitals [10/12/19 0817]  Enc Vitals Group     BP      Pulse      Resp      Temp      Temp src      SpO2      Weight      Height      Head Circumference      Peak Flow      Pain Score 9     Pain Loc      Pain Edu?      Excl. in [place]?    No data found.  Updated Vital Signs BP 116/78 (BP Location: Right Arm)   Pulse 68   Temp 99 F (37.2 C) (Oral)   Resp 16   SpO2 100%   Visual Acuity Right Eye Distance:   Left Eye Distance:   Bilateral Distance:    Right Eye Near:   Left Eye Near:    Bilateral Near:     Physical Exam Vitals and nursing note reviewed.  Constitutional:      General: She is not in acute distress.    Appearance: She is well-developed. She is not ill-appearing.  HENT:     Head: Normocephalic and atraumatic.  Eyes:     General: No scleral icterus.    Extraocular Movements: Extraocular movements intact.     Conjunctiva/sclera: Conjunctivae normal.  Cardiovascular:     Rate and Rhythm: Normal rate and regular rhythm.     Heart sounds: No murmur.  Pulmonary:      Effort: Pulmonary effort is normal. No respiratory distress.     Breath sounds: Normal breath sounds.  Abdominal:     General: Abdomen is flat. There is no distension.  Palpations: Abdomen is soft.     Tenderness: There is abdominal tenderness in the suprapubic area. There is no right CVA tenderness or left CVA tenderness.  Genitourinary:    Uterus: Tender (tender with palpation, no appreciable mass).   Musculoskeletal:     Cervical back: Neck supple.  Skin:    General: Skin is warm and dry.  Neurological:     General: No focal deficit present.     Mental Status: She is alert and oriented to person, place, and time.  Psychiatric:        Mood and Affect: Mood normal.        Behavior: Behavior normal.      UC Treatments / Results  Labs (all labs ordered are listed, but only abnormal results are displayed) Labs Reviewed  POCT URINALYSIS DIP (DEVICE) - Abnormal; Notable for the following components:      Result Value   Bilirubin Urine SMALL (*)    Ketones, ur TRACE (*)    Hgb urine dipstick MODERATE (*)    Protein, ur 30 (*)    All other components within normal limits  URINE CULTURE    EKG   Radiology No results found.  Procedures Procedures (including critical care time)  Medications Ordered in UC Medications - No data to display  Initial Impression / Assessment and Plan / UC Course  I have reviewed the triage vital signs and the nursing notes.  Pertinent labs & imaging results that were available during my care of the patient were reviewed by me and considered in my medical decision making (see chart for details).  Chart review conducted, to include previous results.    #Vaginal Bleeding #uterine pain Presents with 4 days of uterine pain and vagina bleeding. UA with blood, but believe this is 2/2 vagina bleeding, no urinary symptoms, leuks or nitrites. No vaginal discharge or pain, less likely infectious. Negative Pap 05/2019. Believe this may be fibroid vs  break through menstrual cycle on progesterone injection.  - given h. Pylori and reflux, no NSAIDs, recommended tylenol - Gyn information given with follow up recommendation. - follow up with PCP if so desired.   Final Clinical Impressions(s) / UC Diagnoses   Final diagnoses:  Vaginal bleeding  Uterine pain     Discharge Instructions     I believe this is a gynecologic issue related to your uterus, possibly a uterine fibroid. We can not be sure of this without an ultrasound. I have sent a urine culture to evaluate for possible urinary tract infection, we will notify you if treatment is necessary.  -Call either your primary care or the supplied Gynecology Office to schedule an appointment, preferably this week.  Take 2 x 500mg  tylenol (extra strength), every 6-8 hours for pain.  - avoid ibuprofen, aleeve , advil due to recent stomach issues  If your bleeding becomes worse, pain becomes unbearable, develop fever, or vaginal discharge- return to this clinic, report the Emergency Department or follow up with your primary care to be reevaluated.       ED Prescriptions    Medication Sig Dispense Auth. Provider   acetaminophen (TYLENOL) 500 MG tablet Take 1 tablet (500 mg total) by mouth every 6 (six) hours as needed. 30 tablet Sade Hollon, Marguerita Beards, PA-C     PDMP not reviewed this encounter.   Purnell Shoemaker, PA-C 10/12/19 1006

## 2019-10-12 NOTE — Discharge Instructions (Addendum)
I believe this is a gynecologic issue related to your uterus, possibly a uterine fibroid. We can not be sure of this without an ultrasound. I have sent a urine culture to evaluate for possible urinary tract infection, we will notify you if treatment is necessary.  -Call either your primary care or the supplied Gynecology Office to schedule an appointment, preferably this week.  Take 2 x 500mg  tylenol (extra strength), every 6-8 hours for pain.  - avoid ibuprofen, aleeve , advil due to recent stomach issues  If your bleeding becomes worse, pain becomes unbearable, develop fever, or vaginal discharge- return to this clinic, report the Emergency Department or follow up with your primary care to be reevaluated.

## 2019-10-23 ENCOUNTER — Ambulatory Visit: Payer: Federal, State, Local not specified - PPO | Admitting: Gastroenterology

## 2019-10-27 ENCOUNTER — Encounter: Payer: Self-pay | Admitting: Gastroenterology

## 2019-10-27 ENCOUNTER — Ambulatory Visit (INDEPENDENT_AMBULATORY_CARE_PROVIDER_SITE_OTHER): Payer: Federal, State, Local not specified - PPO | Admitting: Gastroenterology

## 2019-10-27 ENCOUNTER — Other Ambulatory Visit: Payer: Federal, State, Local not specified - PPO

## 2019-10-27 VITALS — BP 120/78 | HR 68 | Temp 98.7°F | Ht 61.0 in | Wt 166.0 lb

## 2019-10-27 DIAGNOSIS — K219 Gastro-esophageal reflux disease without esophagitis: Secondary | ICD-10-CM

## 2019-10-27 DIAGNOSIS — K3189 Other diseases of stomach and duodenum: Secondary | ICD-10-CM | POA: Diagnosis not present

## 2019-10-27 DIAGNOSIS — Q394 Esophageal web: Secondary | ICD-10-CM

## 2019-10-27 DIAGNOSIS — R1030 Lower abdominal pain, unspecified: Secondary | ICD-10-CM

## 2019-10-27 DIAGNOSIS — Z8619 Personal history of other infectious and parasitic diseases: Secondary | ICD-10-CM | POA: Diagnosis not present

## 2019-10-27 DIAGNOSIS — B3781 Candidal esophagitis: Secondary | ICD-10-CM | POA: Diagnosis not present

## 2019-10-27 DIAGNOSIS — R1319 Other dysphagia: Secondary | ICD-10-CM

## 2019-10-27 DIAGNOSIS — R1310 Dysphagia, unspecified: Secondary | ICD-10-CM

## 2019-10-27 DIAGNOSIS — K31A0 Gastric intestinal metaplasia, unspecified: Secondary | ICD-10-CM

## 2019-10-27 NOTE — Patient Instructions (Addendum)
Stop Omeprazole on March 21st for 2 weeks, then give stool sample to check for H.pylori. Then restart Omeprazole back at once daily dosing.   It has been recommended to you by your physician that you have a(n) EGDcompleted. We did not schedule the procedure(s) today as it is not needed until May/June 2021. Please contact our office at [phone] should you decide to have the procedure completed. You will be scheduled for a pre-visit and procedure at that time.  Due to recent changes in healthcare laws, you may see the results of your imaging and laboratory studies on MyChart before your provider has had a chance to review them.  We understand that in some cases there may be results that are confusing or concerning to you. Not all laboratory results come back in the same time frame and the provider may be waiting for multiple results in order to interpret others.  Please give [place] 48 hours in order for your provider to thoroughly review all the results before contacting the office for clarification of your results.    If you are age 61 or younger, your body mass index should be between 19-25. Your Body mass index is 31.37 kg/m. If this is out of the aformentioned range listed, please consider follow up with your Primary Care Provider.   Thank you for choosing me and [facility].  Dr. Rush Landmark

## 2019-10-27 NOTE — Progress Notes (Signed)
[place] VISIT   Primary Care Provider Billie Ruddy, MD [place] [address] [phone]  Patient Profile: Patricia Barron is a 44 y.o. 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 pylori (pending eradication follow-up), GIM.  The patient presents to the [facility] for an evaluation and management of problem(s) noted below:  Problem List 1. Lower abdominal pain   2. History of Esophageal candidiasis ([place])   3. History of Helicobacter pylori infection   4. Gastroesophageal reflux disease without esophagitis   5. Esophageal dysphagia   6. Upper esophageal web     History of Present Illness This is the patient's first visit to the outpatient [place] clinic.  She has experienced symptoms of heartburn/pyrosis for more than 20 years.  In the past symptoms were mostly based on when she had dietary indiscretions.  If she took Tums she would have improvement and did not necessarily need to have anything stronger.  Within a day or so they can subside and she would do well for days to weeks at a time.  She does remember that after her third child was born things seem to have been more recurrent after that time.  The patient describes a burning sensation and discomfort in the mid chest and esophageal region.  She became significantly concerned when she began to experience more frequent solid food dysphagia as well as odynophagia we even with liquids.  The symptoms have been ongoing for at least the last 2-1/2 to 3 months.  In November she was initiated on 20 mg twice daily PPI.  By being on 20 mg twice daily PPI she has had an improvement in many of her symptoms and has not had overt dysphagia but continues to have concerns about the reason and etiology of her symptoms.  Occasionally the patient will have indigestion but the increased PPI  dosing has been helpful.  She has normal bowel movements on a daily basis that are brown without any other significant changes or blood in the stool.  She has no abdominal pain.  She has no nausea or vomiting at this time.  She has a maternal grandmother who has a history of heartburn but no other significant GI malignancies in her family history.  The patient does not take NSAIDs on a regular basis.  She has never had an upper or lower endoscopy.  Interval History The patient returns for scheduled follow-up.  She was found on her endoscopy to have Candida esophagitis as well as H. pylori.  She was initiated on treatment for both but to began with Candida conversion.  Few days into her Candida treatment she had lower abdominal pain.  As a result of being on the acid medicine as well as the fluconazole the patient stopped her acid suppression medication.  She completed her fluconazole.  She restarted her acid suppression medication.  Pain has been so bad at one point she went to the emergency department.  It was felt that as a result of also having some vaginal bleeding but this is more likely to be gynecologic in etiology.  Patient is now scheduled to see a gynecologist in the next few days.  Patient has remained on PPI therapy after completion of Pylera.  Patient is not having significant nausea or vomiting.  Her dysphagia is slightly improved although she had one episode of solid food dysphagia requiring regurgitation  but that was before the completion of the Pylera treatment and the Candida treatment.  Patient denies significant changes in bowel habits.  The lower abdominal discomfort remains but it is not improved with bowel habits.  GI Review of Systems Positive as above Negative for odynophagia, nausea, vomiting, melena, hematochezia  Review of Systems General: Denies fevers/chills/unintentional weight loss HEENT: Denies oral lesions or thrush Cardiovascular: Denies chest pain Pulmonary: Denies  shortness of breath Gastroenterological: See HPI Genitourinary: Denies darkened urine Hematological: Denies easy bruising/bleeding Dermatological: Denies jaundice Psychological: Mood is stable   Medications Current Outpatient Medications  Medication Sig Dispense Refill   acetaminophen (TYLENOL) 500 MG tablet Take 1 tablet (500 mg total) by mouth every 6 (six) hours as needed. 30 tablet 0   albuterol (PROVENTIL HFA;VENTOLIN HFA) 108 (90 Base) MCG/ACT inhaler Inhale 2 puffs into the lungs every 4 (four) hours as needed for wheezing or shortness of breath. 1 Inhaler 1   fluconazole (DIFLUCAN) 200 MG tablet Take 1 tablet (200 mg total) by mouth daily. 400 mg day 1, 200 mg day 2-14 15 tablet 0   medroxyPROGESTERone (DEPO-PROVERA) 150 MG/ML injection Depo-Provera 150 mg/mL intramuscular suspension  Inject 1 mL every 3 months by intramuscular route for 90 days.     Multiple Vitamins-Calcium (ONE-A-DAY WOMENS FORMULA PO) Take 1 tablet by mouth daily.     omeprazole (PRILOSEC) 20 MG capsule Take 2 capsules (40 mg total) by mouth 2 (two) times daily before a meal. 60 capsule 3   bismuth-metronidazole-tetracycline (PYLERA) 140-125-125 MG capsule Take 3 capsules by mouth 4 (four) times daily -  before meals and at bedtime for 10 days. 120 capsule 0   No current facility-administered medications for this visit.    Allergies No Known Allergies  Histories Past Medical History:  Diagnosis Date   Asthma    GERD (gastroesophageal reflux disease)    Past Surgical History:  Procedure Laterality Date   none     Social History   Socioeconomic History   Marital status: Single    Spouse name: Not on file   Number of children: 4   Years of education: Not on file   Highest education level: Not on file  Occupational History   Occupation: customer service  Tobacco Use   Smoking status: Never Smoker   Smokeless tobacco: Never Used  Substance and Sexual Activity   Alcohol use:  No   Drug use: No   Sexual activity: Not Currently    Birth control/protection: Injection  Other Topics Concern   Not on file  Social History Narrative   Single;   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         Social Determinants of Health   Financial Resource Strain:    Difficulty of Paying Living Expenses: Not on file  Food Insecurity:    Worried About Charity fundraiser in the Last Year: Not on file   [employer] of Food in the Last Year: Not on file  Transportation Needs:    Lack of Transportation (Medical): Not on file   Lack of Transportation (Non-Medical): Not on file  Physical Activity:    Days of Exercise per Week: Not on file   Minutes of Exercise per Session: Not on file  Stress:    Feeling of Stress : Not on file  Social Connections:    Frequency of Communication with Friends and Family: Not on file   Frequency of Social Gatherings with Friends and Family: Not on file  Attends Religious Services: Not on file   Active Member of Clubs or Organizations: Not on file   Attends Archivist Meetings: Not on file   Marital Status: Not on file  Intimate Partner Violence:    Fear of Current or Ex-Partner: Not on file   Emotionally Abused: Not on file   Physically Abused: Not on file   Sexually Abused: Not on file   Family History  Problem Relation Age of Onset   Diabetes Mother    Hypertension Mother    Hyperlipidemia Father    Hypertension Father    Diabetes Maternal Grandmother    Kidney failure Maternal Grandmother    GER disease Maternal Grandmother    Colon cancer Neg Hx    Liver cancer Neg Hx    Stomach cancer Neg Hx    Rectal cancer Neg Hx    Esophageal cancer Neg Hx    Inflammatory bowel disease Neg Hx    Pancreatic cancer Neg Hx    I have reviewed her medical, social, and family history in detail and updated the electronic medical record as necessary.    PHYSICAL EXAMINATION  BP 120/78    Pulse 68    Temp 98.7  F (37.1 C)    Ht 5\' 1"  (1.549 m)    Wt 166 lb (75.3 kg)    BMI 31.37 kg/m  Wt Readings from Last 3 Encounters:  10/27/19 166 lb (75.3 kg)  09/29/19 164 lb (74.4 kg)  09/16/19 164 lb (74.4 kg)  GEN: NAD, appears stated age, doesn't appear chronically ill PSYCH: Cooperative, without pressured speech EYE: Conjunctivae pink, sclerae anicteric ENT: MMM CV: Nontachycardic RESP: No audible wheezing present GI: NABS, soft, minimal tenderness to palpation in lower abdomen, rounded, without rebound or guarding MSK/EXT: No lower extremity edema SKIN: No jaundice NEURO:  Alert & Oriented x 3, no focal deficits   REVIEW OF DATA  I reviewed the following data at the time of this encounter:  GI Procedures and Studies  January 2021 EGD - No gross lesions in esophagus proximally/distally. White nummular lesions in esophageal mucosa in the middle of esophagus. Biopsied for EoE/LoE/Candida. - Dilated with mucosal wrent. - Small hiatal hernia. - Erythematous mucosa in the antrum. No other gross lesions in the stomach. Biopsied. - No gross lesions in the duodenal bulb, in the first portion of the duodenum and in the second portion of the duodenum. Biopsied. Pathology Diagnosis 1. 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 - WARTHIN-STARRY IS POSITIVE FOR HELICOBACTER PYLORI. - NO DYSPLASIA OR MALIGNANCY. 2. Surgical [P], esophageal - CANDIDA ESOPHAGITIS. - PAS IS POSITIVE FOR FUNGAL ORGANISMS. - NO INTESTINAL METAPLASIA, DYSPLASIA, OR MALIGNANCY. 3. Surgical [P], duodenal - PEPTIC DUODENITIS. - NO DYSPLASIA OR MALIGNANCY.  Laboratory Studies  Reviewed those in epic  Imaging Studies  No relevant studies to review   ASSESSMENT  Ms. Jagoe is a 44 y.o. 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 pylori (pending eradication follow-up),  GIM.  The patient is seen today for evaluation and management of:  1. Lower abdominal pain   2. History of Esophageal candidiasis ([place])   3. History of Helicobacter pylori infection   4. Gastroesophageal reflux disease without esophagitis   5. Esophageal dysphagia   6. Upper esophageal web    The patient is hemodynamically and clinically stable at this time.  Patient was found to  have Candida esophagitis as well as H. pylori gastritis as well as gastric intestinal metaplasia.  She has completed treatment of both.  She needs H. pylori eradication follow-up which we will plan in a few weeks.  She will continue her PPI dosing until March 21 and then she will stop omeprazole for 2 weeks.  Then she will perform an H. pylori stool antigen.  She will then restart omeprazole and once daily dosing.  We will plan to repeat her upper endoscopy at approximately 3 to 4 months out also with the finding of hopefully H. pylori having been eradicated.  We will plan for gastric mapping biopsies at that time and decide on surveillance as necessary.  The patient has had some lower abdominal discomfort of an unclear etiology although she is having vaginal bleeding.  She is going to be evaluated by her new gynecologist within the next week.  I have given her my card so that we can get the notation of the findings and clinic visit to be sent to [place] for further evaluation.  It is not clear that she will need further work-up from a GI perspective in regards to the lower abdominal discomfort but we can certainly entertain abdominal ultrasound imaging or cross-sectional imaging with a CT depending on the findings and her situation of recurrent discomfort.  Diagnostic colonoscopy can also be a sitter.  Regards to her dysphagia it is better now that she has been treated for Candida and completed H. pylori treatment but we will have to see.  At follow-up endoscopy will consider repeat dilation.  If symptoms of dysphagia persist to  consider esophageal manometry.  All patient questions were answered, to the best of my ability, and the patient agrees to the aforementioned plan of action with follow-up as indicated.   PLAN  Continue PPI therapy until March 21 and then stop for 2 weeks -Then obtain H. pylori stool antigen to evaluate for eradication --Then may restart omeprazole at once daily dosing Repeat endoscopy in May/June for gastric mapping in setting of GIM Consider repeat dilation Dysphagia persists consider esophageal manometry Lower abdominal discomfort persists after gynecologic evaluation consider abdominal ultrasound or CT abdomen/pelvis and/or diagnostic colonoscopy   Orders Placed This Encounter  Procedures   Helicobacter pylori special antigen    New Prescriptions   No medications on file   Modified Medications   No medications on file    Planned Follow Up No follow-ups on file.   Total Time in Face-to-Face and in Coordination of Care for patient including review/personal interpretation of prior testing, medical history, examination, medication adjustment, documentation with the EHR is 25 minutes   Justice Britain, MD [facility] Advanced Endoscopy Office # [id number]

## 2019-10-29 DIAGNOSIS — Z6831 Body mass index (BMI) 31.0-31.9, adult: Secondary | ICD-10-CM | POA: Diagnosis not present

## 2019-10-29 DIAGNOSIS — Z3040 Encounter for surveillance of contraceptives, unspecified: Secondary | ICD-10-CM | POA: Diagnosis not present

## 2019-10-29 DIAGNOSIS — N920 Excessive and frequent menstruation with regular cycle: Secondary | ICD-10-CM | POA: Diagnosis not present

## 2019-10-29 DIAGNOSIS — R102 Pelvic and perineal pain: Secondary | ICD-10-CM | POA: Diagnosis not present

## 2019-10-29 DIAGNOSIS — Z1239 Encounter for other screening for malignant neoplasm of breast: Secondary | ICD-10-CM | POA: Diagnosis not present

## 2019-10-31 DIAGNOSIS — K219 Gastro-esophageal reflux disease without esophagitis: Secondary | ICD-10-CM | POA: Insufficient documentation

## 2019-10-31 DIAGNOSIS — K3189 Other diseases of stomach and duodenum: Secondary | ICD-10-CM | POA: Insufficient documentation

## 2019-10-31 DIAGNOSIS — R1319 Other dysphagia: Secondary | ICD-10-CM | POA: Insufficient documentation

## 2019-10-31 DIAGNOSIS — Q394 Esophageal web: Secondary | ICD-10-CM | POA: Insufficient documentation

## 2019-10-31 DIAGNOSIS — B3781 Candidal esophagitis: Secondary | ICD-10-CM | POA: Insufficient documentation

## 2019-10-31 DIAGNOSIS — R1310 Dysphagia, unspecified: Secondary | ICD-10-CM | POA: Insufficient documentation

## 2019-10-31 DIAGNOSIS — K31A0 Gastric intestinal metaplasia, unspecified: Secondary | ICD-10-CM | POA: Insufficient documentation

## 2019-10-31 DIAGNOSIS — Z8619 Personal history of other infectious and parasitic diseases: Secondary | ICD-10-CM | POA: Insufficient documentation

## 2019-10-31 DIAGNOSIS — R1030 Lower abdominal pain, unspecified: Secondary | ICD-10-CM | POA: Insufficient documentation

## 2019-11-10 DIAGNOSIS — Z1231 Encounter for screening mammogram for malignant neoplasm of breast: Secondary | ICD-10-CM | POA: Diagnosis not present

## 2019-11-10 DIAGNOSIS — Z30430 Encounter for insertion of intrauterine contraceptive device: Secondary | ICD-10-CM | POA: Diagnosis not present

## 2019-11-17 DIAGNOSIS — Z30431 Encounter for routine checking of intrauterine contraceptive device: Secondary | ICD-10-CM | POA: Diagnosis not present

## 2019-12-07 ENCOUNTER — Other Ambulatory Visit: Payer: Federal, State, Local not specified - PPO

## 2019-12-07 DIAGNOSIS — Z8619 Personal history of other infectious and parasitic diseases: Secondary | ICD-10-CM | POA: Diagnosis not present

## 2019-12-07 DIAGNOSIS — R1030 Lower abdominal pain, unspecified: Secondary | ICD-10-CM | POA: Diagnosis not present

## 2019-12-07 DIAGNOSIS — R1319 Other dysphagia: Secondary | ICD-10-CM

## 2019-12-07 DIAGNOSIS — R1310 Dysphagia, unspecified: Secondary | ICD-10-CM | POA: Diagnosis not present

## 2019-12-07 DIAGNOSIS — B3781 Candidal esophagitis: Secondary | ICD-10-CM | POA: Diagnosis not present

## 2019-12-08 LAB — HELICOBACTER PYLORI  SPECIAL ANTIGEN
MICRO NUMBER:: 10327142
SPECIMEN QUALITY: ADEQUATE

## 2020-01-04 ENCOUNTER — Ambulatory Visit (HOSPITAL_COMMUNITY)
Admission: EM | Admit: 2020-01-04 | Discharge: 2020-01-04 | Disposition: A | Discharge status: Home / Self Care | Payer: Federal, State, Local not specified - PPO | Attending: Urgent Care | Admitting: Urgent Care

## 2020-01-04 ENCOUNTER — Other Ambulatory Visit: Payer: Self-pay

## 2020-01-04 ENCOUNTER — Ambulatory Visit (INDEPENDENT_AMBULATORY_CARE_PROVIDER_SITE_OTHER): Payer: Federal, State, Local not specified - PPO

## 2020-01-04 ENCOUNTER — Encounter (HOSPITAL_COMMUNITY): Payer: Self-pay

## 2020-01-04 DIAGNOSIS — M25561 Pain in right knee: Secondary | ICD-10-CM

## 2020-01-04 HISTORY — DX: Benign neoplasm of connective and other soft tissue, unspecified: D21.9

## 2020-01-04 MED ORDER — NAPROXEN 500 MG PO TABS: 500.0000 mg | ORAL_TABLET | Freq: Two times a day (BID) | ORAL | 0 refills | Status: DC

## 2020-01-04 NOTE — ED Provider Notes (Signed)
[facility]   MRN: [id number] DOB: 1976-07-31  Subjective:   Patricia Barron is a 44 y.o. female presenting for 1 week hx of acute onset right knee pain. Has been favoring her knee, walks with a limp. Denies trauma, redness, hx of arthritis or gout. Patient does a lot of walking/standing for her work.   No current facility-administered medications for this encounter.  Current Outpatient Medications:  .  acetaminophen (TYLENOL) 500 MG tablet, Take 1 tablet (500 mg total) by mouth every 6 (six) hours as needed., Disp: 30 tablet, Rfl: 0 .  albuterol (PROVENTIL HFA;VENTOLIN HFA) 108 (90 Base) MCG/ACT inhaler, Inhale 2 puffs into the lungs every 4 (four) hours as needed for wheezing or shortness of breath., Disp: 1 Inhaler, Rfl: 1 .  bismuth-metronidazole-tetracycline (PYLERA) 140-125-125 MG capsule, Take 3 capsules by mouth 4 (four) times daily -  before meals and at bedtime for 10 days., Disp: 120 capsule, Rfl: 0 .  fluconazole (DIFLUCAN) 200 MG tablet, Take 1 tablet (200 mg total) by mouth daily. 400 mg day 1, 200 mg day 2-14, Disp: 15 tablet, Rfl: 0 .  medroxyPROGESTERone (DEPO-PROVERA) 150 MG/ML injection, Depo-Provera 150 mg/mL intramuscular suspension  Inject 1 mL every 3 months by intramuscular route for 90 days., Disp: , Rfl:  .  Multiple Vitamins-Calcium (ONE-A-DAY WOMENS FORMULA PO), Take 1 tablet by mouth daily., Disp: , Rfl:  .  omeprazole (PRILOSEC) 20 MG capsule, Take 2 capsules (40 mg total) by mouth 2 (two) times daily before a meal., Disp: 60 capsule, Rfl: 3   No Known Allergies  Past Medical History:  Diagnosis Date  . Asthma   . Fibroids   . GERD (gastroesophageal reflux disease)      Past Surgical History:  Procedure Laterality Date  . none      Family History  Problem Relation Age of Onset  . Diabetes Mother   . Hypertension Mother   . Hyperlipidemia Father   . Hypertension Father   . Diabetes Maternal Grandmother   . Kidney failure Maternal  Grandmother   . GER disease Maternal Grandmother   . Colon cancer Neg Hx   . Liver cancer Neg Hx   . Stomach cancer Neg Hx   . Rectal cancer Neg Hx   . Esophageal cancer Neg Hx   . Inflammatory bowel disease Neg Hx   . Pancreatic cancer Neg Hx     Social History   Tobacco Use  . Smoking status: Never Smoker  . Smokeless tobacco: Never Used  Substance Use Topics  . Alcohol use: No  . Drug use: No    ROS   Objective:   Vitals: BP 135/85   Pulse 63   Temp 98.5 F (36.9 C) (Oral)   Resp 16   Ht 5\' 1"  (1.549 m)   Wt 160 lb (72.6 kg)   SpO2 100%   BMI 30.23 kg/m   Physical Exam Constitutional:      General: She is not in acute distress.    Appearance: Normal appearance. She is well-developed. She is not ill-appearing, toxic-appearing or diaphoretic.  HENT:     Head: Normocephalic and atraumatic.     Nose: Nose normal.     Mouth/Throat:     Mouth: Mucous membranes are moist.     Pharynx: Oropharynx is clear.  Eyes:     General: No scleral icterus.    Extraocular Movements: Extraocular movements intact.     Pupils: Pupils are equal, round, and reactive to light.  Cardiovascular:     Rate and Rhythm: Normal rate.  Pulmonary:     Effort: Pulmonary effort is normal.  Musculoskeletal:     Right knee: No swelling, effusion, erythema, ecchymosis, lacerations or bony tenderness. Normal range of motion. Tenderness present. No medial joint line tenderness. Normal alignment and normal patellar mobility.  Skin:    General: Skin is warm and dry.  Neurological:     General: No focal deficit present.     Mental Status: She is alert and oriented to person, place, and time.  Psychiatric:        Mood and Affect: Mood normal.        Behavior: Behavior normal.     DG Knee Complete 4 Views Right  Result Date: 01/04/2020 CLINICAL DATA:  Right knee pain for 1 week. No known injury. EXAM: RIGHT KNEE - COMPLETE 4+ VIEW COMPARISON:  02/16/2019 FINDINGS: The joint spaces are  normal. No acute bony findings or osteochondral lesion. No joint effusion. IMPRESSION: Normal right knee radiographs. Electronically Signed   By: Marijo Sanes M.D.   On: 01/04/2020 11:30   Assessment and Plan :   PDMP not reviewed this encounter.  1. Acute pain of right knee     Suspect inflammatory pain secondary to overuse from work. Use NSAID, rest, conservative management. Counseled patient on potential for adverse effects with medications prescribed/recommended today, ER and return-to-clinic precautions discussed, patient verbalized understanding.    Jaynee Eagles, [place] 01/05/20 [phone]

## 2020-01-04 NOTE — ED Triage Notes (Signed)
Pt c/o 8/10 throbbing pain in right kneex1 wk. Pt denies injury. Pt limped to triage room.

## 2020-02-04 ENCOUNTER — Other Ambulatory Visit: Payer: Self-pay | Admitting: Family Medicine

## 2020-02-04 DIAGNOSIS — K219 Gastro-esophageal reflux disease without esophagitis: Secondary | ICD-10-CM

## 2020-02-05 ENCOUNTER — Encounter: Payer: Self-pay | Admitting: Gastroenterology

## 2020-02-05 ENCOUNTER — Ambulatory Visit (INDEPENDENT_AMBULATORY_CARE_PROVIDER_SITE_OTHER): Payer: Federal, State, Local not specified - PPO | Admitting: Gastroenterology

## 2020-02-05 ENCOUNTER — Ambulatory Visit (INDEPENDENT_AMBULATORY_CARE_PROVIDER_SITE_OTHER): Payer: Self-pay

## 2020-02-05 VITALS — BP 108/74 | HR 71 | Ht 61.0 in | Wt 171.6 lb

## 2020-02-05 DIAGNOSIS — Z8619 Personal history of other infectious and parasitic diseases: Secondary | ICD-10-CM | POA: Diagnosis not present

## 2020-02-05 DIAGNOSIS — K3189 Other diseases of stomach and duodenum: Secondary | ICD-10-CM

## 2020-02-05 DIAGNOSIS — Q394 Esophageal web: Secondary | ICD-10-CM

## 2020-02-05 DIAGNOSIS — Z1159 Encounter for screening for other viral diseases: Secondary | ICD-10-CM | POA: Diagnosis not present

## 2020-02-05 DIAGNOSIS — K219 Gastro-esophageal reflux disease without esophagitis: Secondary | ICD-10-CM

## 2020-02-05 DIAGNOSIS — R1030 Lower abdominal pain, unspecified: Secondary | ICD-10-CM

## 2020-02-05 DIAGNOSIS — Z01818 Encounter for other preprocedural examination: Secondary | ICD-10-CM

## 2020-02-05 DIAGNOSIS — K31A0 Gastric intestinal metaplasia, unspecified: Secondary | ICD-10-CM

## 2020-02-05 DIAGNOSIS — D219 Benign neoplasm of connective and other soft tissue, unspecified: Secondary | ICD-10-CM

## 2020-02-05 MED ORDER — OMEPRAZOLE 20 MG PO CPDR: 20.0000 mg | DELAYED_RELEASE_CAPSULE | Freq: Two times a day (BID) | ORAL | 2 refills | Status: DC

## 2020-02-05 NOTE — Progress Notes (Signed)
[place] VISIT   Primary Care Provider Billie Ruddy, MD [place] [id number] [phone]  Patient Profile: JOYELLE SIEDLECKI is a 44 y.o. female with a pmh significant for asthma, GERD, fibroids, UES stricture (status post dilation) history of Candida esophagitis, history of H. pylori (status post eradication ), GIM.  The patient presents to the [facility] for an evaluation and management of problem(s) noted below:  Problem List 1. History of Helicobacter infection   2. Intestinal metaplasia of gastric mucosa   3. Gastroesophageal reflux disease, unspecified whether esophagitis present   4. Upper esophageal web   5. Lower abdominal pain   6. Fibroids   7. Preop examination     History of Present Illness Please see initial consultation note for full details of HPI.  Interval History Returns for scheduled follow-up.  At the time of her endoscopy we found that she had H. pylori gastritis as well as intestinal metaplasia.  She was treated and tells me that she has done well post treatment.  She continued PPI therapy.  She stopped it for 2 weeks in effort of trying to do the stool H. pylori antigen.  During that time off of her PPI she could feel her GERD symptoms significantly.  She was happy to go back on her PPI once the stool antigen had been given.  She denies any dysphagia or odynophagia.  She feels much better now on the PPI being back on board.  She has seen her gynecologist and was told that her lower abdominal discomfort is likely a result of fibroids.  With that being said she still has it on occasion.  Patient is having bowel movements regularly.  She has never undergone colon cancer screening.  GI Review of Systems Positive as above Negative for nausea, vomiting, melena, hematochezia  Review of Systems General: Denies fevers/chills/unintentional weight loss HEENT: Denies oral lesions or  thrush Cardiovascular: Denies chest pain Pulmonary: Denies shortness of breath Gastroenterological: See HPI Genitourinary: Denies darkened urine Hematological: Denies easy bruising/bleeding Dermatological: Denies jaundice Psychological: Mood is stable   Medications Current Outpatient Medications  Medication Sig Dispense Refill  . albuterol (PROVENTIL HFA;VENTOLIN HFA) 108 (90 Base) MCG/ACT inhaler Inhale 2 puffs into the lungs every 4 (four) hours as needed for wheezing or shortness of breath. 1 Inhaler 1  . bismuth-metronidazole-tetracycline (PYLERA) 140-125-125 MG capsule Take 3 capsules by mouth 4 (four) times daily -  before meals and at bedtime for 10 days. 120 capsule 0  . omeprazole (PRILOSEC) 20 MG capsule Take 1 capsule (20 mg total) by mouth 2 (two) times daily before a meal. 60 capsule 2   No current facility-administered medications for this visit.    Allergies No Known Allergies  Histories Past Medical History:  Diagnosis Date  . Asthma   . Fibroids   . GERD (gastroesophageal reflux disease)    Past Surgical History:  Procedure Laterality Date  . none     Social History   Socioeconomic History  . Marital status: Single    Spouse name: Not on file  . Number of children: 4  . Years of education: Not on file  . Highest education level: Not on file  Occupational History  . Occupation: customer service  Tobacco Use  . Smoking status: Never Smoker  . Smokeless tobacco: Never Used  Substance and Sexual Activity  . Alcohol use: No  . Drug use: No  . Sexual activity: Not Currently  Birth control/protection: Injection  Other Topics Concern  . 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 Difficulty of Paying Living Expenses:   Food Insecurity:   . Worried About Charity fundraiser in the Last Year:   . Arboriculturist in the Last Year:    Transportation Needs:   . Film/video editor (Medical):   Marland Kitchen Lack of Transportation (Non-Medical):   Physical Activity:   . Days of Exercise per Week:   . Minutes of Exercise per Session:   Stress:   . Feeling of Stress :   Social Connections:   . Frequency of Communication with Friends and Family:   . Frequency of Social Gatherings with Friends and Family:   . Attends Religious Services:   . Active Member of Clubs or Organizations:   . Attends Archivist Meetings:   Marland Kitchen Marital Status:   Intimate Partner Violence:   . Fear of Current or Ex-Partner:   . Emotionally Abused:   Marland Kitchen Physically Abused:   . Sexually Abused:    Family History  Problem Relation Age of Onset  . Diabetes Mother   . Hypertension Mother   . Hyperlipidemia Father   . Hypertension Father   . Diabetes Maternal Grandmother   . Kidney failure Maternal Grandmother   . GER disease Maternal Grandmother   . Colon cancer Neg Hx   . Liver cancer Neg Hx   . Stomach cancer Neg Hx   . Rectal cancer Neg Hx   . Esophageal cancer Neg Hx   . Inflammatory bowel disease Neg Hx   . Pancreatic cancer Neg Hx    I have reviewed her medical, social, and family history in detail and updated the electronic medical record as necessary.    PHYSICAL EXAMINATION  BP 108/74   Pulse 71   Ht 5\' 1"  (1.549 m)   Wt 171 lb 9.6 oz (77.8 kg)   SpO2 98%   BMI 32.42 kg/m  Wt Readings from Last 3 Encounters:  02/05/20 171 lb 9.6 oz (77.8 kg)  01/04/20 160 lb (72.6 kg)  10/27/19 166 lb (75.3 kg)  GEN: NAD, appears stated age, doesn't appear chronically ill PSYCH: Cooperative, without pressured speech EYE: Conjunctivae pink, sclerae anicteric ENT: MMM CV: Nontachycardic RESP: No audible wheezing present GI: NABS, soft, nontender, nondistended, without rebound MSK/EXT: No lower extremity edema SKIN: No jaundice NEURO:  Alert & Oriented x 3, no focal deficits   REVIEW OF DATA  I reviewed the following data at the  time of this encounter:  GI Procedures and Studies  January 2021 EGD - No gross lesions in esophagus proximally/distally. White nummular lesions in esophageal mucosa in the middle of esophagus. Biopsied for EoE/LoE/Candida. - Dilated with mucosal wrent. - Small hiatal hernia. - Erythematous mucosa in the antrum. No other gross lesions in the stomach. Biopsied. - No gross lesions in the duodenal bulb, in the first portion of the duodenum and in the second portion of the duodenum. Biopsied. Pathology Diagnosis 1. 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 - WARTHIN-STARRY IS POSITIVE FOR HELICOBACTER PYLORI. - NO DYSPLASIA OR MALIGNANCY. 2. Surgical [P], esophageal - CANDIDA ESOPHAGITIS. - PAS IS POSITIVE FOR FUNGAL ORGANISMS. - NO INTESTINAL METAPLASIA, DYSPLASIA, OR MALIGNANCY. 3. Surgical [P], duodenal - PEPTIC  DUODENITIS. - NO DYSPLASIA OR MALIGNANCY.  Laboratory Studies  Reviewed those in epic  Imaging Studies  No relevant studies to review   ASSESSMENT  Ms. Schatz is a 44 y.o. female with a pmh significant for asthma, GERD, fibroids, UES stricture (status post dilation) history of Candida esophagitis, history of H. pylori (status post eradication ), GIM.  The patient is seen today for evaluation and management of:  1. History of Helicobacter infection   2. Intestinal metaplasia of gastric mucosa   3. Gastroesophageal reflux disease, unspecified whether esophagitis present   4. Upper esophageal web   5. Lower abdominal pain   6. Fibroids   7. Preop examination    The patient is hemodynamically stable and clinically well.  Patient needs gastric mapping for her gastric intestinal metaplasia and we will plan to move forward with this in the coming weeks.  Her lower abdominal discomfort has been attributed to her fibroids and she is being followed by gynecology.  Her H. pylori infection is now gone and  eradicated based on stool antigen testing.  GERD symptoms remain however.  We will keep her on current dosing of PPI until her endoscopy.  We briefly discussed that there are other options available for patients in regards to surgical management or TIF endoscopic management or [place] magnetic management of GERD symptoms for certain patients.  That is something we can consider in the future.  We will need to consider the possibility of esophageal manometry in the future for her dysphagia although dysphagia has improved.  The risks and benefits of endoscopic evaluation were discussed with the patient; these include but are not limited to the risk of perforation, infection, bleeding, missed lesions, lack of diagnosis, severe illness requiring hospitalization, as well as anesthesia and sedation related illnesses.  The patient is agreeable to proceed.  She will benefit from colon cancer screening.  We can see if she would be interested in moving forward with a colonoscopy at the same time point as her endoscopy (we did not discuss colonoscopy during the clinic visit but I will have my staff reach out to her next week and effort of trying to discuss whether she wants to try and do both procedures together). All patient questions were answered, to the best of my ability, and the patient agrees to the aforementioned plan of action with follow-up as indicated.   PLAN  Continue PPI therapy at 20 mg twice daily for now Surveillance endoscopy for gastric mapping of GIM to be scheduled Consider repeat dilation based on patient's symptoms We will ask patient if she desires colonoscopy for colon cancer screening to be performed at same time as follow-up endoscopy Esophageal manometry in future should patient decide she wants to undergo further evaluation for other therapies other than medical therapy for GERD Consider potential role of CT abdomen/pelvis in future for lower abdominal discomfort   Orders Placed This  Encounter  Procedures  . SARS Coronavirus 2 ([facility] ONLY)  . Ambulatory referral to Gastroenterology    New Prescriptions   OMEPRAZOLE (PRILOSEC) 20 MG CAPSULE    Take 1 capsule (20 mg total) by mouth 2 (two) times daily before a meal.   Modified Medications   No medications on file    Planned Follow Up No follow-ups on file.   Total Time in Face-to-Face and in Coordination of Care for patient including review/personal interpretation of prior testing, medical history, examination, medication adjustment, documentation with the EHR is 25 minutes   Valarie Merino  Mansouraty, MD [facility]oscopy Office # [phone]

## 2020-02-05 NOTE — Patient Instructions (Addendum)
You have been scheduled for an endoscopy. Please follow written instructions given to you at your visit today. If you use inhalers (even only as needed), please bring them with you on the day of your procedure.  We have sent the following medications to your pharmacy for you to pick up at your convenience: Omeprazole   Due to recent changes in healthcare laws, you may see the results of your imaging and laboratory studies on MyChart before your provider has had a chance to review them.  We understand that in some cases there may be results that are confusing or concerning to you. Not all laboratory results come back in the same time frame and the provider may be waiting for multiple results in order to interpret others.  Please give [place] 48 hours in order for your provider to thoroughly review all the results before contacting the office for clarification of your results.   If you are age 45 or older, your body mass index should be between 23-30. Your Body mass index is 32.42 kg/m. If this is out of the aforementioned range listed, please consider follow up with your Primary Care Provider.  If you are age 45 or younger, your body mass index should be between 19-25. Your Body mass index is 32.42 kg/m. If this is out of the aformentioned range listed, please consider follow up with your Primary Care Provider.   Try to decrease Omeprazole to 20mg  once daily- Rx has been sent for twice daily dosing in the event that once daily is ineffective.   Thank you for choosing me and [facility].  Dr. Rush Landmark

## 2020-02-06 LAB — SARS CORONAVIRUS 2 (TAT 6-24 HRS): SARS Coronavirus 2: NEGATIVE

## 2020-02-07 ENCOUNTER — Encounter: Payer: Self-pay | Admitting: Gastroenterology

## 2020-02-07 DIAGNOSIS — D219 Benign neoplasm of connective and other soft tissue, unspecified: Secondary | ICD-10-CM | POA: Insufficient documentation

## 2020-02-08 ENCOUNTER — Telehealth: Payer: Self-pay

## 2020-02-08 NOTE — Telephone Encounter (Signed)
Left message on pt cell phone asking for return call.

## 2020-02-08 NOTE — Telephone Encounter (Signed)
Spoke with pt. She would like to proceed with just EGD for now. I was not able to make contact with the pt until after lunch. Pt did not want to r/s as she had already made plans for transportation on tomorrow.Pt is aware that she will need screening colonoscopy at some point this year.

## 2020-02-08 NOTE — Telephone Encounter (Signed)
Pt returned your call.  

## 2020-02-08 NOTE — Telephone Encounter (Signed)
-----   Message from Irving Copas., MD sent at 02/07/2020  2:09 PM EDT ----- Regarding: Follow-up Patricia Barron,I had forgotten to talk with the patient about the role of a screening colonoscopy.She has had issues of lower abdominal discomfort which has been attributed to her fibroids which I think is the most likely source however since we know we need to pursue an endoscopic evaluation from above we could try to get her up-to-date for colon cancer screening with a colonoscopy.However I know that her date for her endoscopy is Tuesday.If the patient wants to try to do both procedures together then certainly we can try to reschedule her so that we can do 1 anesthesia for both procedures rather than having to do another anesthesia event later this year for her colonoscopy.You can talk with her and see what she wants to do I am fine either way but saying that she would be due for colon cancer screening if she was interested in moving forward with the colonoscopy certainly lets get her set up for a double procedure in the next few weeks and we can just not do it on Tuesday unless she only wants to do the endoscopy first.Let me know what she thinks.GM

## 2020-02-08 NOTE — Telephone Encounter (Signed)
Patient returned your call, please call patient one more time.   

## 2020-02-08 NOTE — Telephone Encounter (Signed)
Thanks for talking with her. Sounds like a plan. I'll see her tomorrow. GM

## 2020-02-09 ENCOUNTER — Other Ambulatory Visit: Payer: Self-pay

## 2020-02-09 ENCOUNTER — Encounter: Payer: Self-pay | Admitting: Gastroenterology

## 2020-02-09 ENCOUNTER — Ambulatory Visit: Payer: Federal, State, Local not specified - PPO | Admitting: Gastroenterology

## 2020-02-09 VITALS — BP 132/92 | HR 74 | Temp 98.4°F | Ht 61.0 in | Wt 171.0 lb

## 2020-02-09 MED ORDER — SODIUM CHLORIDE 0.9 % IV SOLN: 500.0000 mL | Freq: Once | INTRAVENOUS | Status: DC

## 2020-02-09 NOTE — Progress Notes (Signed)
PATIENT HAD A BAGEL AT 11:30AM TODAY AND DR MANSOURATY WILL RESCHEDULE HER PROCEDURE.

## 2020-02-16 ENCOUNTER — Ambulatory Visit: Payer: Self-pay

## 2020-02-16 ENCOUNTER — Ambulatory Visit (AMBULATORY_SURGERY_CENTER): Payer: Self-pay | Admitting: *Deleted

## 2020-02-16 ENCOUNTER — Other Ambulatory Visit: Payer: Self-pay | Admitting: Gastroenterology

## 2020-02-16 ENCOUNTER — Other Ambulatory Visit: Payer: Self-pay

## 2020-02-16 VITALS — Ht 61.0 in | Wt 169.0 lb

## 2020-02-16 DIAGNOSIS — Z1211 Encounter for screening for malignant neoplasm of colon: Secondary | ICD-10-CM

## 2020-02-16 DIAGNOSIS — K219 Gastro-esophageal reflux disease without esophagitis: Secondary | ICD-10-CM

## 2020-02-16 DIAGNOSIS — Z1159 Encounter for screening for other viral diseases: Secondary | ICD-10-CM | POA: Diagnosis not present

## 2020-02-16 NOTE — Progress Notes (Signed)

## 2020-02-17 LAB — SARS CORONAVIRUS 2 (TAT 6-24 HRS): SARS Coronavirus 2: NEGATIVE

## 2020-02-19 ENCOUNTER — Other Ambulatory Visit: Payer: Self-pay

## 2020-02-19 ENCOUNTER — Ambulatory Visit (AMBULATORY_SURGERY_CENTER): Payer: Federal, State, Local not specified - PPO | Admitting: Gastroenterology

## 2020-02-19 ENCOUNTER — Encounter: Payer: Self-pay | Admitting: Gastroenterology

## 2020-02-19 VITALS — BP 127/86 | HR 78 | Temp 97.8°F | Resp 21 | Ht 61.0 in | Wt 169.0 lb

## 2020-02-19 DIAGNOSIS — Z1211 Encounter for screening for malignant neoplasm of colon: Secondary | ICD-10-CM

## 2020-02-19 DIAGNOSIS — K2970 Gastritis, unspecified, without bleeding: Secondary | ICD-10-CM

## 2020-02-19 DIAGNOSIS — K319 Disease of stomach and duodenum, unspecified: Secondary | ICD-10-CM | POA: Diagnosis not present

## 2020-02-19 DIAGNOSIS — K2950 Unspecified chronic gastritis without bleeding: Secondary | ICD-10-CM | POA: Diagnosis not present

## 2020-02-19 DIAGNOSIS — K219 Gastro-esophageal reflux disease without esophagitis: Secondary | ICD-10-CM

## 2020-02-19 MED ORDER — SODIUM CHLORIDE 0.9 % IV SOLN: 500.0000 mL | Freq: Once | INTRAVENOUS | Status: DC

## 2020-02-19 NOTE — Op Note (Signed)
[facility] Patient Name: Patricia Barron Procedure Date: 02/19/2020 11:29 AM MRN: [id number] Endoscopist: Justice Britain , MD Age: 44 Referring MD:  Date of Birth: 1976/02/02 Gender: Female Account #: [id number] Procedure:                Upper GI endoscopy Indications:              Follow-up of Candida esophagitis, Intestinal                            metaplasia, Follow-up of intestinal metaplasia Medicines:                Monitored Anesthesia Care Procedure:                Pre-Anesthesia Assessment:                           - Prior to the procedure, a History and Physical                            was performed, and patient medications and                            allergies were reviewed. The patient's tolerance of                            previous anesthesia was also reviewed. The risks                            and benefits of the procedure and the sedation                            options and risks were discussed with the patient.                            All questions were answered, and informed consent                            was obtained. Prior Anticoagulants: The patient has                            taken no previous anticoagulant or antiplatelet                            agents. ASA Grade Assessment: II - A patient with                            mild systemic disease. After reviewing the risks                            and benefits, the patient was deemed in                            satisfactory condition to undergo the procedure.  After obtaining informed consent, the endoscope was                            passed under direct vision. Throughout the                            procedure, the patient's blood pressure, pulse, and                            oxygen saturations were monitored continuously. The                            Endoscope was introduced through the mouth, and                             advanced to the second part of duodenum. The upper                            GI endoscopy was accomplished without difficulty.                            The patient tolerated the procedure. Scope In: Scope Out: Findings:                 No gross lesions were noted in the entire esophagus                            - previous Candidiasis is gone.                           The Z-line was regular and was found 35 cm from the                            incisors.                           Localized mildly erythematous mucosa without                            bleeding was found in the gastric antrum.                           No other gross mucosal lesions were noted in the                            entire examined stomach. Due to patient's history                            of intestinal metaplasia, gastric mapping was                            performed. Biopsies were taken with a cold forceps                            for  histology from the fundus. Biopsies were taken                            with a cold forceps for histology from the cardia.                            Biopsies were taken with a cold forceps for                            histology from the lesser curve. Biopsies were                            taken with a cold forceps for histology from the                            greater curve. Biopsies were taken with a cold                            forceps for histology from the incisura. Biopsies                            were taken with a cold forceps for histology from                            the antrum.                           No gross lesions were noted in the duodenal bulb,                            in the first portion of the duodenum and in the                            second portion of the duodenum. Complications:            No immediate complications. Estimated Blood Loss:     Estimated blood loss was minimal. Impression:               - No gross lesions  in esophagus.                           - Z-line regular, 35 cm from the incisors.                           - Erythematous mucosa in the antrum.                           - No gross lesions in the stomach. Biopsied.                           - No gross lesions in the duodenal bulb, in the                            first portion  of the duodenum and in the second                            portion of the duodenum. Recommendation:           - The patient will be observed post-procedure,                            until all discharge criteria are met.                           - Discharge patient to home.                           - Patient has a contact number available for                            emergencies. The signs and symptoms of potential                            delayed complications were discussed with the                            patient. Return to normal activities tomorrow.                            Written discharge instructions were provided to the                            patient.                           - Resume previous diet.                           - Continue present medications.                           - Please continue Omeprazole 20 mg twice daily                            through July. In August transition to once daily                            dosing and maintain.                           - Await pathology results.                           - Repeat upper endoscopy in 3 years for                            surveillance most likely for GIM surveillance,                            unless  pathology concerning.                           - The findings and recommendations were discussed                            with the patient. Justice Britain, MD 02/19/2020 12:17:40 PM

## 2020-02-19 NOTE — Patient Instructions (Signed)
YOU HAD AN ENDOSCOPIC PROCEDURE TODAY AT THE [facility]:   Refer to the procedure report that was given to you for any specific questions about what was found during the examination.  If the procedure report does not answer your questions, please call your gastroenterologist to clarify.  If you requested that your care partner not be given the details of your procedure findings, then the procedure report has been included in a sealed envelope for you to review at your convenience later.  YOU SHOULD EXPECT: Some feelings of bloating in the abdomen. Passage of more gas than usual.  Walking can help get rid of the air that was put into your GI tract during the procedure and reduce the bloating. If you had a lower endoscopy (such as a colonoscopy or flexible sigmoidoscopy) you may notice spotting of blood in your stool or on the toilet paper. If you underwent a bowel prep for your procedure, you may not have a normal bowel movement for a few days.  Please Note:  You might notice some irritation and congestion in your nose or some drainage.  This is from the oxygen used during your procedure.  There is no need for concern and it should clear up in a day or so.  SYMPTOMS TO REPORT IMMEDIATELY:   Following lower endoscopy (colonoscopy or flexible sigmoidoscopy):  Excessive amounts of blood in the stool  Significant tenderness or worsening of abdominal pains  Swelling of the abdomen that is new, acute  Fever of 100F or higher   Following upper endoscopy (EGD)  Vomiting of blood or coffee ground material  New chest pain or pain under the shoulder blades  Painful or persistently difficult swallowing  New shortness of breath  Fever of 100F or higher  Black, tarry-looking stools  For urgent or emergent issues, a gastroenterologist can be reached at any hour by calling [phone]. Do not use MyChart messaging for urgent concerns.    DIET:  We do recommend a small meal at first, but  then you may proceed to your regular diet.  Drink plenty of fluids but you should avoid alcoholic beverages for 24 hours.  ACTIVITY:  You should plan to take it easy for the rest of today and you should NOT DRIVE or use heavy machinery until tomorrow (because of the sedation medicines used during the test).    FOLLOW UP: Our staff will call the number listed on your records 48-72 hours following your procedure to check on you and address any questions or concerns that you may have regarding the information given to you following your procedure. If we do not reach you, we will leave a message.  We will attempt to reach you two times.  During this call, we will ask if you have developed any symptoms of COVID 19. If you develop any symptoms (ie: fever, flu-like symptoms, shortness of breath, cough etc.) before then, please call [phone].  If you test positive for Covid 19 in the 2 weeks post procedure, please call and report this information to us.    If any biopsies were taken you will be contacted by phone or by letter within the next 1-3 weeks.  Please call us at [phone] if you have not heard about the biopsies in 3 weeks.    SIGNATURES/CONFIDENTIALITY: You and/or your care partner have signed paperwork which will be entered into your electronic medical record.  These signatures attest to the fact that that the information above on   your After Visit Summary has been reviewed and is understood.  Full responsibility of the confidentiality of this discharge information lies with you and/or your care-partner. 

## 2020-02-19 NOTE — Progress Notes (Signed)
VS-CW  Pt's states no medical or surgical changes since previsit or office visit.  

## 2020-02-19 NOTE — Op Note (Signed)
[facility] Patient Name: Patricia Barron Procedure Date: 02/19/2020 11:29 AM MRN: [id number] Endoscopist: Justice Britain , MD Age: 44 Referring MD:  Date of Birth: [birth year] Gender: Female Account #: [id number] Procedure:                Colonoscopy Indications:              Screening for colorectal malignant neoplasm, This                            is the patient's first colonoscopy Medicines:                Monitored Anesthesia Care Procedure:                Pre-Anesthesia Assessment:                           - Prior to the procedure, a History and Physical                            was performed, and patient medications and                            allergies were reviewed. The patient's tolerance of                            previous anesthesia was also reviewed. The risks                            and benefits of the procedure and the sedation                            options and risks were discussed with the patient.                            All questions were answered, and informed consent                            was obtained. Prior Anticoagulants: The patient has                            taken no previous anticoagulant or antiplatelet                            agents. ASA Grade Assessment: II - A patient with                            mild systemic disease. After reviewing the risks                            and benefits, the patient was deemed in                            satisfactory condition to undergo the procedure.  After obtaining informed consent, the colonoscope                            was passed under direct vision. Throughout the                            procedure, the patient's blood pressure, pulse, and                            oxygen saturations were monitored continuously. The                            Colonoscope was introduced through the anus and                            advanced to the 5  cm into the ileum. The                            colonoscopy was performed without difficulty. The                            patient tolerated the procedure. The quality of the                            bowel preparation was good. The terminal ileum,                            ileocecal valve, appendiceal orifice, and rectum                            were photographed. Scope In: 11:39:37 AM Scope Out: 11:52:39 AM Scope Withdrawal Time: 0 hours 8 minutes 20 seconds  Total Procedure Duration: 0 hours 13 minutes 2 seconds  Findings:                 The digital rectal exam findings include                            hemorrhoids. Pertinent negatives include no                            palpable rectal lesions.                           The terminal ileum and ileocecal valve appeared                            normal.                           Normal mucosa was found in the entire colon.                           Non-bleeding non-thrombosed internal hemorrhoids  were found during retroflexion, during perianal                            exam and during digital exam. The hemorrhoids were                            Grade II (internal hemorrhoids that prolapse but                            reduce spontaneously). Complications:            No immediate complications. Estimated Blood Loss:     Estimated blood loss: none. Impression:               - Hemorrhoids found on digital rectal exam.                           - The examined portion of the ileum was normal.                           - Normal mucosa in the entire examined colon.                           - Non-bleeding non-thrombosed internal hemorrhoids. Recommendation:           - Proceed to scheduled ERCP.                           - Repeat colonoscopy in 10 years for screening                            purposes.                           - The findings and recommendations were discussed                             with the patient. Justice Britain, MD 02/19/2020 12:12:08 PM

## 2020-02-19 NOTE — Progress Notes (Signed)
A/ox3, pleased with MAC, report to RN 

## 2020-02-19 NOTE — Progress Notes (Signed)
Called to room to assist during endoscopic procedure.  Patient ID and intended procedure confirmed with present staff. Received instructions for my participation in the procedure from the performing physician.  

## 2020-02-23 ENCOUNTER — Telehealth: Payer: Self-pay

## 2020-02-23 NOTE — Telephone Encounter (Signed)
  Follow up Call-  Call back number 02/19/2020 02/09/2020 09/29/2019  Post procedure Call Back phone  # [phone]  Permission to leave phone message Yes Yes Yes  Some recent data might be hidden     Patient questions:  Do you have a fever, pain , or abdominal swelling? No. Pain Score  0 *  Have you tolerated food without any problems? Yes.    Have you been able to return to your normal activities? Yes.    Do you have any questions about your discharge instructions: Diet   No. Medications  No. Follow up visit  No.  Do you have questions or concerns about your Care? No.  Actions: * If pain score is 4 or above: No action needed, pain <4.  1. Have you developed a fever since your procedure? no  2.   Have you had an respiratory symptoms (SOB or cough) since your procedure? no  3.   Have you tested positive for COVID 19 since your procedure no  4.   Have you had any family members/close contacts diagnosed with the COVID 19 since your procedure?  no   If yes to any of these questions please route to Joylene John, RN and Erenest Rasher, RN

## 2020-02-24 ENCOUNTER — Encounter: Payer: Self-pay | Admitting: Gastroenterology

## 2020-02-26 ENCOUNTER — Telehealth: Payer: Self-pay | Admitting: Gastroenterology

## 2020-02-26 NOTE — Telephone Encounter (Signed)
Patient is returning your call.  

## 2020-02-26 NOTE — Telephone Encounter (Signed)
Mansouraty, Telford Nab., MD  Timothy Lasso, RN Patricia Barron, Please let the patient know that the preliminary stains are showing evidence of prior H. pylori but do not seem to be consistent with an active infection for pathology notation. We will see if there final stains show anything different but for now I would let her know that no plans for repeat endoscopy until 3 years from now no plans for retreatment at this time. I will let you and her know if I hear anything different in the next week. Thanks. GM

## 2020-02-29 NOTE — Telephone Encounter (Signed)
The patient has been notified of this information and all questions answered. She will await further communication regarding the test results

## 2020-02-29 NOTE — Telephone Encounter (Signed)
Patient is returning your call.  

## 2020-02-29 NOTE — Telephone Encounter (Signed)
Left message on machine to call back  

## 2020-04-19 DIAGNOSIS — M545 Low back pain: Secondary | ICD-10-CM | POA: Diagnosis not present

## 2020-04-19 DIAGNOSIS — R1030 Lower abdominal pain, unspecified: Secondary | ICD-10-CM | POA: Diagnosis not present

## 2020-05-26 ENCOUNTER — Other Ambulatory Visit: Payer: Self-pay | Admitting: Gastroenterology

## 2020-06-02 DIAGNOSIS — H00019 Hordeolum externum unspecified eye, unspecified eyelid: Secondary | ICD-10-CM | POA: Diagnosis not present

## 2020-06-10 DIAGNOSIS — Z20822 Contact with and (suspected) exposure to covid-19: Secondary | ICD-10-CM | POA: Diagnosis not present

## 2020-06-22 DIAGNOSIS — R102 Pelvic and perineal pain: Secondary | ICD-10-CM | POA: Diagnosis not present

## 2020-06-22 DIAGNOSIS — T8389XA Other specified complication of genitourinary prosthetic devices, implants and grafts, initial encounter: Secondary | ICD-10-CM | POA: Diagnosis not present

## 2020-06-30 DIAGNOSIS — T8389XA Other specified complication of genitourinary prosthetic devices, implants and grafts, initial encounter: Secondary | ICD-10-CM | POA: Diagnosis not present

## 2020-06-30 DIAGNOSIS — D259 Leiomyoma of uterus, unspecified: Secondary | ICD-10-CM | POA: Diagnosis not present

## 2020-06-30 DIAGNOSIS — G588 Other specified mononeuropathies: Secondary | ICD-10-CM | POA: Diagnosis not present

## 2020-06-30 DIAGNOSIS — R102 Pelvic and perineal pain: Secondary | ICD-10-CM | POA: Diagnosis not present

## 2020-07-01 DIAGNOSIS — Z30431 Encounter for routine checking of intrauterine contraceptive device: Secondary | ICD-10-CM | POA: Diagnosis not present

## 2020-07-01 DIAGNOSIS — R102 Pelvic and perineal pain: Secondary | ICD-10-CM | POA: Diagnosis not present

## 2020-07-01 DIAGNOSIS — T8389XA Other specified complication of genitourinary prosthetic devices, implants and grafts, initial encounter: Secondary | ICD-10-CM | POA: Diagnosis not present

## 2020-07-01 DIAGNOSIS — G588 Other specified mononeuropathies: Secondary | ICD-10-CM | POA: Diagnosis not present

## 2020-07-01 DIAGNOSIS — D259 Leiomyoma of uterus, unspecified: Secondary | ICD-10-CM | POA: Diagnosis not present

## 2020-07-06 DIAGNOSIS — N946 Dysmenorrhea, unspecified: Secondary | ICD-10-CM | POA: Diagnosis not present

## 2020-07-06 DIAGNOSIS — G588 Other specified mononeuropathies: Secondary | ICD-10-CM | POA: Diagnosis not present

## 2020-09-16 DIAGNOSIS — R519 Headache, unspecified: Secondary | ICD-10-CM | POA: Diagnosis not present

## 2020-09-16 DIAGNOSIS — B349 Viral infection, unspecified: Secondary | ICD-10-CM | POA: Diagnosis not present

## 2020-09-16 DIAGNOSIS — R110 Nausea: Secondary | ICD-10-CM | POA: Diagnosis not present

## 2020-09-16 DIAGNOSIS — R42 Dizziness and giddiness: Secondary | ICD-10-CM | POA: Diagnosis not present

## 2020-10-15 DIAGNOSIS — J4520 Mild intermittent asthma, uncomplicated: Secondary | ICD-10-CM | POA: Diagnosis not present

## 2020-10-15 DIAGNOSIS — R1013 Epigastric pain: Secondary | ICD-10-CM | POA: Diagnosis not present

## 2020-10-15 DIAGNOSIS — R0602 Shortness of breath: Secondary | ICD-10-CM | POA: Diagnosis not present

## 2020-10-15 DIAGNOSIS — Z20822 Contact with and (suspected) exposure to covid-19: Secondary | ICD-10-CM | POA: Diagnosis not present

## 2020-11-16 DIAGNOSIS — Z1211 Encounter for screening for malignant neoplasm of colon: Secondary | ICD-10-CM | POA: Diagnosis not present

## 2020-11-16 DIAGNOSIS — Z3040 Encounter for surveillance of contraceptives, unspecified: Secondary | ICD-10-CM | POA: Diagnosis not present

## 2020-11-16 DIAGNOSIS — Z01419 Encounter for gynecological examination (general) (routine) without abnormal findings: Secondary | ICD-10-CM | POA: Diagnosis not present

## 2020-11-16 DIAGNOSIS — Z1231 Encounter for screening mammogram for malignant neoplasm of breast: Secondary | ICD-10-CM | POA: Diagnosis not present

## 2020-11-16 LAB — HM MAMMOGRAPHY: HM Mammogram: NORMAL (ref 0–4)

## 2020-11-22 ENCOUNTER — Encounter: Payer: Self-pay | Admitting: Family Medicine

## 2021-05-30 ENCOUNTER — Other Ambulatory Visit: Payer: Self-pay | Admitting: Gastroenterology

## 2021-06-09 DIAGNOSIS — R051 Acute cough: Secondary | ICD-10-CM | POA: Diagnosis not present

## 2021-06-09 DIAGNOSIS — R0981 Nasal congestion: Secondary | ICD-10-CM | POA: Diagnosis not present

## 2021-06-09 DIAGNOSIS — R6883 Chills (without fever): Secondary | ICD-10-CM | POA: Diagnosis not present

## 2021-06-09 DIAGNOSIS — U071 COVID-19: Secondary | ICD-10-CM | POA: Diagnosis not present

## 2021-07-31 ENCOUNTER — Other Ambulatory Visit: Payer: Self-pay | Admitting: Gastroenterology

## 2021-08-10 ENCOUNTER — Encounter: Payer: Self-pay | Admitting: Nurse Practitioner

## 2021-08-10 ENCOUNTER — Ambulatory Visit: Payer: Federal, State, Local not specified - PPO | Admitting: Nurse Practitioner

## 2021-08-10 VITALS — BP 130/90 | HR 76 | Ht 61.0 in | Wt 174.2 lb

## 2021-08-10 DIAGNOSIS — K219 Gastro-esophageal reflux disease without esophagitis: Secondary | ICD-10-CM

## 2021-08-10 MED ORDER — OMEPRAZOLE 20 MG PO CPDR: 20.0000 mg | DELAYED_RELEASE_CAPSULE | Freq: Two times a day (BID) | ORAL | 3 refills | Status: DC

## 2021-08-10 NOTE — Progress Notes (Signed)
     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 Still has to be very careful with beef and sometimes rice.  Otherwise no swallowing problems.   # Intestinal metaplasia. On 3 year surveillance EGD  recall list for  2024.   # Colon cancer screening. Completed 2021. Due for 10 year colonoscopy June 3031.    HISTORY OF PRESENT ILLNESS    Chief Complaint : needs refill on Omeprazole  Patricia Barron is a 45 y.o. female known to Dr. Rush Landmark with a past medical history of asthma, GERD, fibroids, UES stricture (status post dilation) history of Candida esophagitis, history of H. pylori (status post eradication ).   Additional medical history as listed in [facility] .   [name] needs a refill on omeprazole .  She is taking 20 mg BID before meals. No GERD symptoms as long as she takes her medication. She has a history of an UES stricture, s/p dilation in June 2021. No problems swallowing UNLESS she eats beef and sometimes rice but this has always been the case, even following dilation.   Current Medications, Allergies, Past Medical History, Past Surgical History, Family History and Social History were reviewed in [employer] record.     Current Outpatient Medications  Medication Sig Dispense Refill   albuterol (PROVENTIL HFA;VENTOLIN HFA) 108 (90 Base) MCG/ACT inhaler Inhale 2 puffs into the lungs every 4 (four) hours as needed for wheezing or shortness of breath. 1 Inhaler 1   Multiple Vitamin (MULTIVITAMIN) tablet Take 1 tablet by mouth daily.     omeprazole (PRILOSEC) 20 MG capsule TAKE 1 CAPSULE(20 MG) BY MOUTH TWICE DAILY BEFORE A MEAL 180 capsule 2   No current facility-administered medications for this visit.    Review of Systems: No chest pain. No shortness of breath. No urinary complaints.   PHYSICAL EXAM :    Wt Readings from Last 3 Encounters:   08/10/21 174 lb 4 oz (79 kg)  02/19/20 169 lb (76.7 kg)  02/16/20 169 lb (76.7 kg)    BP 130/90   Pulse 76   Ht 5\' 1"  (1.549 m)   Wt 174 lb 4 oz (79 kg)   BMI 32.92 kg/m  Constitutional:  Generally well appearing female in no acute distress. Psychiatric: Pleasant. Normal mood and affect. Behavior is normal. EENT: Pupils normal.  Conjunctivae are normal. No scleral icterus. Neck supple.  Cardiovascular: Normal rate, regular rhythm. No edema Pulmonary/chest: Effort normal and breath sounds normal. No wheezing, rales or rhonchi. Abdominal: Soft, nondistended, nontender. Bowel sounds active throughout. There are no masses palpable. No hepatomegaly. Neurological: Alert and oriented to person place and time. Skin: Skin is warm and dry. No rashes noted.  Tye Savoy, NP  08/10/2021, 10:31 AM

## 2021-08-10 NOTE — Patient Instructions (Signed)
We have sent the following medications to your pharmacy for you to pick up at your convenience: Omeprazole 20 mg twice daily 30-60 minutes before breakfast and dinner.   If you are age 46 or older, your body mass index should be between 23-30. Your Body mass index is 32.92 kg/m. If this is out of the aforementioned range listed, please consider follow up with your Primary Care Provider.  If you are age 27 or younger, your body mass index should be between 19-25. Your Body mass index is 32.92 kg/m. If this is out of the aformentioned range listed, please consider follow up with your Primary Care Provider.   ________________________________________________________  The Patricia Barron [facility] providers would like to encourage you to use [facility] to communicate with providers for non-urgent requests or questions.  Due to long hold times on the telephone, sending your provider a message by [facility] may be a faster and more efficient way to get a response.  Please allow 48 business hours for a response.  Please remember that this is for non-urgent requests.  _______________________________________________________

## 2021-08-10 NOTE — Progress Notes (Signed)
Attending Physician's Attestation   I have reviewed the chart.   I agree with the Advanced Practitioner's note, impression, and recommendations with any updates as below.    Myrian Botello Mansouraty, MD [facility] Advanced Endoscopy Office # [phone]  

## 2021-09-13 ENCOUNTER — Other Ambulatory Visit: Payer: Self-pay

## 2021-09-13 ENCOUNTER — Encounter: Payer: Self-pay | Admitting: Family Medicine

## 2021-09-13 ENCOUNTER — Ambulatory Visit (INDEPENDENT_AMBULATORY_CARE_PROVIDER_SITE_OTHER): Payer: Federal, State, Local not specified - PPO

## 2021-09-13 ENCOUNTER — Ambulatory Visit (INDEPENDENT_AMBULATORY_CARE_PROVIDER_SITE_OTHER): Payer: Federal, State, Local not specified - PPO | Admitting: Family Medicine

## 2021-09-13 VITALS — BP 120/90 | HR 83 | Temp 98.3°F | Wt 175.4 lb

## 2021-09-13 DIAGNOSIS — M545 Low back pain, unspecified: Secondary | ICD-10-CM

## 2021-09-13 DIAGNOSIS — R030 Elevated blood-pressure reading, without diagnosis of hypertension: Secondary | ICD-10-CM

## 2021-09-13 DIAGNOSIS — R7303 Prediabetes: Secondary | ICD-10-CM | POA: Diagnosis not present

## 2021-09-13 NOTE — Patient Instructions (Signed)
Can use topical creams/rubs such as Biofreeze, IcyHot, Aspercreme.  These can be found at your local drugstore.  Okay to continue naproxen and Flexeril as needed.  You can also use heat on your back.

## 2021-09-13 NOTE — Progress Notes (Signed)
Subjective:    Patient ID: Patricia Barron, female    DOB: February 26, 1976, 46 y.o.   MRN: [id number]  Chief Complaint  Patient presents with   Back Pain    Low back pain for a while. Thinks may be fibroids or could be something else. States cannot stand longer than 15 mins of so and starts having painpain goes across lower back, is a constant pain, will have to sit down slowly , squat to pick up anything 2/2 not being able to bend over and pain will last 2-3 days. Has taken naproxen sod 550 mg and helped a little.    HPI Patient lost to follow-up was seen today for ongoing concern.  Patient endorses low back pain x2 years.  Not recall what started abdomens.  Patient endorses recent episode of back pain which started last Friday.  After standing more than 15-20 minutes.  Pain is noted as sharp, 8-9/10.  Nothing makes it better.  Denies any heavy lifting, pushing, pulling, recent injury.  Taking naproxen 550 mg and Flexeril that she had left over.  Denies weakness in LEs, loss of bowel or bladder.  Patient works nights at the [employer].  Past Medical History:  Diagnosis Date   Asthma    Fibroids    GERD (gastroesophageal reflux disease)    Heart murmur    born with     No Known Allergies  ROS General: Denies fever, chills, night sweats, changes in weight, changes in appetite HEENT: Denies headaches, ear pain, changes in vision, rhinorrhea, sore throat CV: Denies CP, palpitations, SOB, orthopnea Pulm: Denies SOB, cough, wheezing GI: Denies abdominal pain, nausea, vomiting, diarrhea, constipation GU: Denies dysuria, hematuria, frequency, vaginal discharge Msk: Denies muscle cramps, joint pains  + bilateral low back pain Neuro: Denies weakness, numbness, tingling Skin: Denies rashes, bruising Psych: Denies depression, anxiety, hallucinations     Objective:    Blood pressure 120/90, pulse 83, temperature 98.3 F (36.8 C), temperature source Oral, weight 175 lb 6.4 oz (79.6 kg), SpO2  99 %.  Gen. Pleasant, well-nourished, in no distress, normal affect   HEENT: [place]/AT, face symmetric, conjunctiva clear, no scleral icterus, PERRLA, EOMI, nares patent without drainage Lungs: no accessory muscle use Cardiovascular: RRR, no peripheral edema Musculoskeletal: No TTP of cervical, thoracic spine.  TTP of midline lower lumbar and lumbar paraspinal muscles.  No sciatica.  Bilateral LE strength 5/5. no deformities, no cyanosis or clubbing, normal tone Neuro:  A&Ox3, CN II-XII intact, normal gait Skin:  Warm, no lesions/ rash   Wt Readings from Last 3 Encounters:  08/10/21 174 lb 4 oz (79 kg)  02/19/20 169 lb (76.7 kg)  02/16/20 169 lb (76.7 kg)    Lab Results  Component Value Date   WBC 9.3 04/23/2019   HGB 12.2 04/23/2019   HCT 37.3 04/23/2019   PLT 207.0 04/23/2019   GLUCOSE 93 04/23/2019   CHOL 164 04/23/2019   TRIG 103.0 04/23/2019   HDL 40.50 04/23/2019   LDLCALC 103 (H) 04/23/2019   ALT 15 09/10/2013   AST 16 09/10/2013   NA 136 04/23/2019   K 4.4 04/23/2019   CL 105 04/23/2019   CREATININE 0.86 04/23/2019   BUN 11 04/23/2019   CO2 24 04/23/2019   TSH 0.96 04/23/2019   HGBA1C 6.1 04/23/2019    Assessment/Plan:  Lumbar back pain  -Okay to continue Flexeril and naproxen.  Has refills left. -Discussed OTC creams such as Aspercreme, Biofreeze, IcyHot. -Also discussed using heat and stretching.  Ergonomic modifications to workspace and consider supportive insoles for shoes -We will obtain x-ray. -Also discussed PT. - Plan: DG Lumbar Spine Complete  Elevated blood pressure without diagnosis of hypertension -Possibly elevated 2/2 pain/discomfort from low back -Lifestyle modifications -Continue p.o. intake of water -Encouraged to monitor BP  Prediabetes -Lifestyle modification strongly encouraged -Hemoglobin A1c 6.1% on 04/23/19  Patient encouraged to schedule CPE in the next few weeks  F/u as needed  Grier Mitts, MD

## 2021-09-20 ENCOUNTER — Ambulatory Visit (INDEPENDENT_AMBULATORY_CARE_PROVIDER_SITE_OTHER): Payer: Federal, State, Local not specified - PPO | Admitting: Family Medicine

## 2021-09-20 ENCOUNTER — Encounter: Payer: Self-pay | Admitting: Family Medicine

## 2021-09-20 VITALS — BP 122/88 | HR 88 | Temp 98.6°F | Ht 61.0 in | Wt 177.0 lb

## 2021-09-20 DIAGNOSIS — E7841 Elevated Lipoprotein(a): Secondary | ICD-10-CM

## 2021-09-20 DIAGNOSIS — J4520 Mild intermittent asthma, uncomplicated: Secondary | ICD-10-CM

## 2021-09-20 DIAGNOSIS — Z0000 Encounter for general adult medical examination without abnormal findings: Secondary | ICD-10-CM

## 2021-09-20 DIAGNOSIS — Z1159 Encounter for screening for other viral diseases: Secondary | ICD-10-CM

## 2021-09-20 DIAGNOSIS — R7303 Prediabetes: Secondary | ICD-10-CM | POA: Diagnosis not present

## 2021-09-20 LAB — COMPREHENSIVE METABOLIC PANEL
ALT: 14 U/L (ref 0–35)
AST: 17 U/L (ref 0–37)
Albumin: 3.8 g/dL (ref 3.5–5.2)
Alkaline Phosphatase: 73 U/L (ref 39–117)
BUN: 8 mg/dL (ref 6–23)
CO2: 27 mEq/L (ref 19–32)
Calcium: 8.6 mg/dL (ref 8.4–10.5)
Chloride: 106 mEq/L (ref 96–112)
Creatinine, Ser: 0.86 mg/dL (ref 0.40–1.20)
GFR: 80.85 mL/min (ref >60.00)
Glucose, Bld: 94 mg/dL (ref 70–99)
Potassium: 3.9 mEq/L (ref 3.5–5.1)
Sodium: 139 mEq/L (ref 135–145)
Total Bilirubin: 0.5 mg/dL (ref 0.2–1.2)
Total Protein: 7.3 g/dL (ref 6.0–8.3)

## 2021-09-20 LAB — LIPID PANEL
Cholesterol: 175 mg/dL (ref 0–200)
HDL: 35.2 mg/dL — ABNORMAL LOW (ref >39.00)
LDL Cholesterol: 117 mg/dL — ABNORMAL HIGH (ref 0–99)
NonHDL: 139.37
Total CHOL/HDL Ratio: 5
Triglycerides: 110 mg/dL (ref 0.0–149.0)
VLDL: 22 mg/dL (ref 0.0–40.0)

## 2021-09-20 LAB — HEMOGLOBIN A1C: Hgb A1c MFr Bld: 6.1 % (ref 4.6–6.5)

## 2021-09-20 LAB — CBC WITH DIFFERENTIAL/PLATELET
Basophils Absolute: 0.1 10*3/uL (ref 0.0–0.1)
Basophils Relative: 0.8 % (ref 0.0–3.0)
Eosinophils Absolute: 0.2 10*3/uL (ref 0.0–0.7)
Eosinophils Relative: 2.2 % (ref 0.0–5.0)
HCT: 40.5 % (ref 36.0–46.0)
Hemoglobin: 13.3 g/dL (ref 12.0–15.0)
Lymphocytes Relative: 34.4 % (ref 12.0–46.0)
Lymphs Abs: 2.7 10*3/uL (ref 0.7–4.0)
MCHC: 32.9 g/dL (ref 30.0–36.0)
MCV: 86.6 fl (ref 78.0–100.0)
Monocytes Absolute: 0.5 10*3/uL (ref 0.1–1.0)
Monocytes Relative: 5.9 % (ref 3.0–12.0)
Neutro Abs: 4.4 10*3/uL (ref 1.4–7.7)
Neutrophils Relative %: 56.7 % (ref 43.0–77.0)
Platelets: 207 10*3/uL (ref 150.0–400.0)
RBC: 4.68 Mil/uL (ref 3.87–5.11)
RDW: 15.1 % (ref 11.5–15.5)
WBC: 7.8 10*3/uL (ref 4.0–10.5)

## 2021-09-20 LAB — TSH: TSH: 0.86 u[IU]/mL (ref 0.35–5.50)

## 2021-09-20 LAB — T4, FREE: Free T4: 0.83 ng/dL (ref 0.60–1.60)

## 2021-09-20 MED ORDER — ALBUTEROL SULFATE HFA 108 (90 BASE) MCG/ACT IN AERS: 2.0000 | INHALATION_SPRAY | RESPIRATORY_TRACT | 4 refills | Status: AC | PRN

## 2021-09-20 NOTE — Progress Notes (Signed)
Subjective:     Patricia Barron is a 46 y.o. female and is here for a comprehensive physical exam. The patient reports no problems.  Patient notes some improvement in low back pain since last OFV 09/13/21.  Mostly occurs with standing on hard floors at work.  Patient requesting refill on albuterol inhaler.  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         Social Determinants of Health   Financial Resource Strain: Not on file  Food Insecurity: Not on file  Transportation Needs: Not on file  Physical Activity: Not on file  Stress: Not on file  Social Connections: Not on file  Intimate Partner Violence: Not on file   Health Maintenance  Topic Date Due   Pneumococcal Vaccine 59-58 Years old (1 - PCV) Never done   Hepatitis C Screening  Never done   COVID-19 Vaccine (3 - Booster for Harris series) 12/21/2020   INFLUENZA VACCINE  12/01/2021 (Originally 04/03/2021)   PAP SMEAR-Modifier  05/05/2022   TETANUS/TDAP  09/11/2023   COLONOSCOPY (Pts 45-55yrs Insurance coverage will need to be confirmed)  02/18/2030   HIV Screening  Completed   HPV VACCINES  Aged Out    The following portions of the patient's history were reviewed and updated as appropriate: allergies, current medications, past family history, past medical history, past social history, past surgical history, and problem list.  Review of Systems Pertinent items noted in HPI  and remainder of comprehensive ROS otherwise negative.   Objective:    BP 122/88 (BP Location: Left Arm, Patient Position: Sitting, Cuff Size: Large)    Pulse 88    Temp 98.6 F (37 C) (Oral)    Ht 5\' 1"  (1.549 m)    Wt 177 lb (80.3 kg)    SpO2 97%    BMI 33.44 kg/m  General appearance: alert, cooperative, and no distress Head: Normocephalic, without obvious abnormality, atraumatic Eyes: conjunctivae/corneas clear. PERRL, EOM's intact. Fundi benign. Ears: normal TM's and external ear canals both ears Nose: Nares normal. Septum midline. Mucosa normal. No drainage or sinus tenderness. Throat: lips, mucosa, and tongue normal; teeth and gums normal Neck: no adenopathy, no carotid bruit, no JVD, supple, symmetrical, trachea midline, and thyroid not enlarged, symmetric, no tenderness/mass/nodules Lungs: clear to auscultation bilaterally Heart: regular rate and rhythm, S1, S2 normal, no murmur, click, rub or gallop Abdomen: soft, non-tender; bowel sounds normal; no masses,  no organomegaly Extremities: extremities normal, atraumatic, no cyanosis or edema Pulses: 2+ and symmetric Skin: Skin color, texture, turgor normal. No rashes or lesions Lymph nodes: Cervical, supraclavicular, and axillary nodes normal. Neurologic: Alert and oriented X 3, normal strength and tone. Normal symmetric reflexes. Normal coordination and gait    Assessment:    Healthy female exam.      Plan:    Anticipatory guidance given  including wearing seatbelts, smoke detectors in the home, increasing physical activity, increasing p.o. intake of water and vegetables. -labs -Mammogram up-to-date done 11/16/2020 -Colonoscopy up-to-date done 02/19/2020 -Pap up-to-date done 05/07/2019 -Immunizations reviewed -Given handout -Neck CPE in 1 year See After Visit Summary for Counseling Recommendations   Mild intermittent asthma without complication -Controlled - Plan: albuterol (VENTOLIN HFA) 108 (90 Base) MCG/ACT  inhaler  Elevated lipoprotein(a)  - Plan: Lipid panel  Prediabetes  -Lifestyle modifications - Plan: Hemoglobin A1c  Encounter for hepatitis C screening test for low risk patient  - Plan: Hep C Antibody  F/u prn  Grier Mitts, MD

## 2021-09-21 LAB — HEPATITIS C ANTIBODY
Hepatitis C Ab: NONREACTIVE
SIGNAL TO CUT-OFF: 0.1 (ref <1.00)

## 2021-11-22 DIAGNOSIS — Z3040 Encounter for surveillance of contraceptives, unspecified: Secondary | ICD-10-CM | POA: Diagnosis not present

## 2021-11-22 DIAGNOSIS — M545 Low back pain, unspecified: Secondary | ICD-10-CM | POA: Diagnosis not present

## 2021-11-22 DIAGNOSIS — Z01419 Encounter for gynecological examination (general) (routine) without abnormal findings: Secondary | ICD-10-CM | POA: Diagnosis not present

## 2021-11-22 DIAGNOSIS — Z1211 Encounter for screening for malignant neoplasm of colon: Secondary | ICD-10-CM | POA: Diagnosis not present

## 2021-11-22 DIAGNOSIS — Z1231 Encounter for screening mammogram for malignant neoplasm of breast: Secondary | ICD-10-CM | POA: Diagnosis not present

## 2021-11-22 DIAGNOSIS — T8389XA Other specified complication of genitourinary prosthetic devices, implants and grafts, initial encounter: Secondary | ICD-10-CM | POA: Diagnosis not present

## 2021-11-30 DIAGNOSIS — D259 Leiomyoma of uterus, unspecified: Secondary | ICD-10-CM | POA: Diagnosis not present

## 2021-11-30 DIAGNOSIS — T8389XA Other specified complication of genitourinary prosthetic devices, implants and grafts, initial encounter: Secondary | ICD-10-CM | POA: Diagnosis not present

## 2021-11-30 DIAGNOSIS — N83201 Unspecified ovarian cyst, right side: Secondary | ICD-10-CM | POA: Diagnosis not present

## 2021-12-27 DIAGNOSIS — M5451 Vertebrogenic low back pain: Secondary | ICD-10-CM | POA: Diagnosis not present

## 2022-03-02 DIAGNOSIS — R599 Enlarged lymph nodes, unspecified: Secondary | ICD-10-CM | POA: Diagnosis not present

## 2022-03-08 DIAGNOSIS — D259 Leiomyoma of uterus, unspecified: Secondary | ICD-10-CM | POA: Diagnosis not present

## 2022-03-08 DIAGNOSIS — N83201 Unspecified ovarian cyst, right side: Secondary | ICD-10-CM | POA: Diagnosis not present

## 2022-04-19 ENCOUNTER — Telehealth: Payer: Self-pay | Admitting: Family Medicine

## 2022-04-19 NOTE — Telephone Encounter (Signed)
Disregard

## 2022-04-20 ENCOUNTER — Ambulatory Visit: Payer: Federal, State, Local not specified - PPO | Admitting: Family Medicine

## 2022-06-14 DIAGNOSIS — Z1231 Encounter for screening mammogram for malignant neoplasm of breast: Secondary | ICD-10-CM | POA: Diagnosis not present

## 2022-06-14 DIAGNOSIS — N83201 Unspecified ovarian cyst, right side: Secondary | ICD-10-CM | POA: Diagnosis not present

## 2022-06-15 DIAGNOSIS — M5431 Sciatica, right side: Secondary | ICD-10-CM | POA: Diagnosis not present

## 2022-06-21 DIAGNOSIS — M5459 Other low back pain: Secondary | ICD-10-CM | POA: Diagnosis not present

## 2022-06-21 DIAGNOSIS — M79604 Pain in right leg: Secondary | ICD-10-CM | POA: Diagnosis not present

## 2022-07-04 DIAGNOSIS — M79604 Pain in right leg: Secondary | ICD-10-CM | POA: Diagnosis not present

## 2022-07-04 DIAGNOSIS — M5459 Other low back pain: Secondary | ICD-10-CM | POA: Diagnosis not present

## 2022-07-17 DIAGNOSIS — Z1152 Encounter for screening for COVID-19: Secondary | ICD-10-CM | POA: Diagnosis not present

## 2022-07-17 DIAGNOSIS — R109 Unspecified abdominal pain: Secondary | ICD-10-CM | POA: Diagnosis not present

## 2022-07-17 DIAGNOSIS — R059 Cough, unspecified: Secondary | ICD-10-CM | POA: Diagnosis not present

## 2022-07-17 DIAGNOSIS — B349 Viral infection, unspecified: Secondary | ICD-10-CM | POA: Diagnosis not present

## 2022-07-18 DIAGNOSIS — E782 Mixed hyperlipidemia: Secondary | ICD-10-CM | POA: Diagnosis not present

## 2022-07-18 DIAGNOSIS — E669 Obesity, unspecified: Secondary | ICD-10-CM | POA: Diagnosis not present

## 2022-07-18 DIAGNOSIS — Z130 Encounter for screening for diseases of the blood and blood-forming organs and certain disorders involving the immune mechanism: Secondary | ICD-10-CM | POA: Diagnosis not present

## 2022-07-18 DIAGNOSIS — E559 Vitamin D deficiency, unspecified: Secondary | ICD-10-CM | POA: Diagnosis not present

## 2022-07-18 DIAGNOSIS — Z1329 Encounter for screening for other suspected endocrine disorder: Secondary | ICD-10-CM | POA: Diagnosis not present

## 2022-07-18 DIAGNOSIS — R635 Abnormal weight gain: Secondary | ICD-10-CM | POA: Diagnosis not present

## 2022-07-18 DIAGNOSIS — R7303 Prediabetes: Secondary | ICD-10-CM | POA: Diagnosis not present

## 2022-07-18 DIAGNOSIS — N951 Menopausal and female climacteric states: Secondary | ICD-10-CM | POA: Diagnosis not present

## 2022-08-08 DIAGNOSIS — E782 Mixed hyperlipidemia: Secondary | ICD-10-CM | POA: Diagnosis not present

## 2022-08-08 DIAGNOSIS — Z6832 Body mass index (BMI) 32.0-32.9, adult: Secondary | ICD-10-CM | POA: Diagnosis not present

## 2022-08-19 ENCOUNTER — Other Ambulatory Visit: Payer: Self-pay | Admitting: Nurse Practitioner

## 2022-08-21 DIAGNOSIS — J029 Acute pharyngitis, unspecified: Secondary | ICD-10-CM | POA: Diagnosis not present

## 2022-08-21 DIAGNOSIS — R030 Elevated blood-pressure reading, without diagnosis of hypertension: Secondary | ICD-10-CM | POA: Diagnosis not present

## 2022-10-17 ENCOUNTER — Ambulatory Visit (INDEPENDENT_AMBULATORY_CARE_PROVIDER_SITE_OTHER): Payer: Federal, State, Local not specified - PPO | Admitting: Physician Assistant

## 2022-10-17 ENCOUNTER — Encounter: Payer: Self-pay | Admitting: Physician Assistant

## 2022-10-17 VITALS — BP 122/80 | HR 62 | Ht 61.0 in | Wt 174.0 lb

## 2022-10-17 DIAGNOSIS — K31A0 Gastric intestinal metaplasia, unspecified: Secondary | ICD-10-CM

## 2022-10-17 DIAGNOSIS — K219 Gastro-esophageal reflux disease without esophagitis: Secondary | ICD-10-CM | POA: Diagnosis not present

## 2022-10-17 DIAGNOSIS — R1310 Dysphagia, unspecified: Secondary | ICD-10-CM | POA: Diagnosis not present

## 2022-10-17 MED ORDER — OMEPRAZOLE 40 MG PO CPDR
40.0000 mg | DELAYED_RELEASE_CAPSULE | Freq: Two times a day (BID) | ORAL | 3 refills | Status: DC
Start: 2022-10-17 — End: 2024-01-14

## 2022-10-17 NOTE — Patient Instructions (Signed)
If you are age 48 or older, your body mass index should be between 23-30. Your Body mass index is 32.88 kg/m. If this is out of the aforementioned range listed, please consider follow up with your Primary Care Provider.  If you are age 54 or younger, your body mass index should be between 19-25. Your Body mass index is 32.88 kg/m. If this is out of the aformentioned range listed, please consider follow up with your Primary Care Provider.   You have been scheduled for an endoscopy. Please follow written instructions given to you at your visit today. If you use inhalers (even only as needed), please bring them with you on the day of your procedure.  We have sent the following medications to your pharmacy for you to pick up at your convenience: Omeprazole 40 mg.   The [facility] providers would like to encourage you to use [facility] to communicate with providers for non-urgent requests or questions.  Due to long hold times on the telephone, sending your provider a message by [facility] may be a faster and more efficient way to get a response.  Please allow 48 business hours for a response.  Please remember that this is for non-urgent requests.   It was a pleasure to see you today!  Thank you for trusting me with your gastrointestinal care!    Ellouise Newer, PA-C

## 2022-10-17 NOTE — Progress Notes (Signed)
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 pylori status post eradication, known to Dr. Rush Landmark, who was referred to me by Billie Ruddy, MD for follow-up of GERD.    02/19/2020 EGD with erythematous mucosa in the antrum and otherwise normal.  At that time recommended repeat upper endoscopy in 3 years for surveillance of gastric intestinal metaplasia.  Colonoscopy on the same day with hemorrhoids.  Repeat recommended in 10 years.    08/10/2021 office visit with Tye Savoy for refill of Omeprazole.  At that time doing well on 20 mg twice daily.    Today, the patient tells me she is having increasing trouble with reflux regardless of her Omeprazole 20 mg twice a day, due to that she is increasing dosage to 40 mg twice a day which helps for the most part.  She would like a new prescription for this higher dose.  Also discusses that she has noticed that food is again getting stuck in her throat on the way down.  This has been occurring over the past few months.  No abdominal pain.    Denies fever, chills, weight loss, blood in her stool, nausea or vomiting.  Past Medical History:  Diagnosis Date   Asthma    Fibroids    GERD (gastroesophageal reflux disease)    Heart murmur    born with     Past Surgical History:  Procedure Laterality Date   none     UPPER GASTROINTESTINAL ENDOSCOPY      Current Outpatient Medications  Medication Sig Dispense Refill   albuterol (VENTOLIN HFA) 108 (90 Base) MCG/ACT inhaler Inhale 2 puffs into the lungs every 4 (four) hours as needed for wheezing or shortness of breath. 1 each 4   Multiple Vitamin (MULTIVITAMIN) tablet Take 1 tablet by mouth daily.     omeprazole (PRILOSEC) 20 MG capsule Take 1 capsule (20 mg total) by mouth 2 (two) times daily before a meal. 180 capsule 3   No current  facility-administered medications for this visit.    Allergies as of 10/17/2022   (No Known Allergies)    Family History  Problem Relation Age of Onset   Diabetes Mother    Hypertension Mother    Hyperlipidemia Father    Hypertension Father    Diabetes Maternal Grandmother    Kidney failure Maternal Grandmother    GER disease Maternal Grandmother    Colon cancer Neg Hx    Liver cancer Neg Hx    Stomach cancer Neg Hx    Rectal cancer Neg Hx    Esophageal cancer Neg Hx    Inflammatory bowel disease Neg Hx    Pancreatic cancer Neg Hx    Colon polyps Neg Hx     Social History   Socioeconomic History   Marital status: Single    Spouse name: Not on file   Number of children: 4   Years of education: Not on file   Highest education level: Not on file  Occupational History   Occupation: customer service  Tobacco Use   Smoking status: Never   Smokeless tobacco: Never  Vaping Use   Vaping Use: Never used  Substance and Sexual Activity   Alcohol use: No   Drug use: No   Sexual activity: Not Currently    Birth control/protection: Injection  Other Topics Concern  Not on file  Social History Narrative   Single;   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         Social Determinants of Health   Financial Resource Strain: Not on file  Food Insecurity: Not on file  Transportation Needs: Not on file  Physical Activity: Not on file  Stress: Not on file  Social Connections: Not on file  Intimate Partner Violence: Not on file    Review of Systems:    Constitutional: No weight loss, fever or chills Cardiovascular: No chest pain Respiratory: No SOB  Gastrointestinal: See HPI and otherwise negative   Physical Exam:  Vital signs: BP 122/80   Pulse 62   Ht 5' 1"$  (1.549 m)   Wt 174 lb (78.9 kg)   BMI 32.88 kg/m   Constitutional:   Pleasant overweight AA female appears to be in NAD, Well developed, Well nourished, alert and cooperative Respiratory: Respirations even and  unlabored. Lungs clear to auscultation bilaterally.   No wheezes, crackles, or rhonchi.  Cardiovascular: Normal S1, S2. No MRG. Regular rate and rhythm. No peripheral edema, cyanosis or pallor.  Gastrointestinal:  Soft, nondistended, nontender. No rebound or guarding. Normal bowel sounds. No appreciable masses or hepatomegaly. Rectal:  Not performed.  Psychiatric:  Demonstrates good judgement and reason without abnormal affect or behaviors.  RELEVANT LABS AND IMAGING: CBC    Component Value Date/Time   WBC 7.8 09/20/2021 1057   RBC 4.68 09/20/2021 1057   HGB 13.3 09/20/2021 1057   HCT 40.5 09/20/2021 1057   PLT 207.0 09/20/2021 1057   MCV 86.6 09/20/2021 1057   MCV 88.7 01/06/2014 1937   MCH 27.5 08/04/2018 1819   MCHC 32.9 09/20/2021 1057   RDW 15.1 09/20/2021 1057   LYMPHSABS 2.7 09/20/2021 1057   MONOABS 0.5 09/20/2021 1057   EOSABS 0.2 09/20/2021 1057   BASOSABS 0.1 09/20/2021 1057    CMP     Component Value Date/Time   NA 139 09/20/2021 1057   K 3.9 09/20/2021 1057   CL 106 09/20/2021 1057   CO2 27 09/20/2021 1057   GLUCOSE 94 09/20/2021 1057   BUN 8 09/20/2021 1057   CREATININE 0.86 09/20/2021 1057   CREATININE 0.79 09/10/2013 1106   CALCIUM 8.6 09/20/2021 1057   PROT 7.3 09/20/2021 1057   ALBUMIN 3.8 09/20/2021 1057   AST 17 09/20/2021 1057   ALT 14 09/20/2021 1057   ALKPHOS 73 09/20/2021 1057   BILITOT 0.5 09/20/2021 1057   GFRNONAA >60 08/04/2018 1819   GFRNONAA >89 09/10/2013 1106   GFRAA >60 08/04/2018 1819   GFRAA >89 09/10/2013 1106    Assessment: 1.  GERD: Increase in symptoms over the past few months requiring increased dose of Omeprazole 2.  Dysphagia: Increasing over the past few months, history of stricture last dilated 09/29/2019; consider stricture most likely 3.  History of gastric intestinal metaplasia: Due for repeat surveillance EGD  Plan: 1.  Increased Omeprazole to 40 mg twice a day, 30-60 minutes before breakfast and dinner.  #180 with  3 refills. 2.  Discussed antidysphagia measures 3.  Scheduled patient for a surveillance/diagnostic EGD in the [place] with possible dilation given dysphagia and history of intestinal metaplasia.  This was scheduled with Dr. Rush Landmark.  Did provide the patient a detailed list of risks for the procedure and she agrees to proceed. Patient is appropriate for endoscopic procedure(s) in the ambulatory ([place]) setting.  4.  Patient to follow in clinic per recommendations after time of procedure.  Anderson Malta  Apolonio Schneiders [facility] 10/17/2022, 9:15 AM  Cc: Billie Ruddy, MD

## 2022-10-18 NOTE — Progress Notes (Signed)
Attending Physician's Attestation   I have reviewed the chart.   I agree with the Advanced Practitioner's note, impression, and recommendations with any updates as below. Will see how the patient's symptoms are doing with twice daily PPI and follow-up after EGD.   Justice Britain, MD [facility] Advanced Endoscopy Office # [id number]

## 2022-12-05 ENCOUNTER — Encounter: Payer: Self-pay | Admitting: Gastroenterology

## 2022-12-05 ENCOUNTER — Ambulatory Visit (AMBULATORY_SURGERY_CENTER): Payer: Federal, State, Local not specified - PPO | Admitting: Gastroenterology

## 2022-12-05 VITALS — BP 115/80 | HR 70 | Temp 97.1°F | Resp 14 | Ht 61.0 in | Wt 174.0 lb

## 2022-12-05 DIAGNOSIS — K222 Esophageal obstruction: Secondary | ICD-10-CM

## 2022-12-05 DIAGNOSIS — R1310 Dysphagia, unspecified: Secondary | ICD-10-CM | POA: Diagnosis not present

## 2022-12-05 DIAGNOSIS — K219 Gastro-esophageal reflux disease without esophagitis: Secondary | ICD-10-CM

## 2022-12-05 DIAGNOSIS — K31A0 Gastric intestinal metaplasia, unspecified: Secondary | ICD-10-CM | POA: Diagnosis not present

## 2022-12-05 DIAGNOSIS — K2950 Unspecified chronic gastritis without bleeding: Secondary | ICD-10-CM | POA: Diagnosis not present

## 2022-12-05 MED ORDER — SODIUM CHLORIDE 0.9 % IV SOLN
500.0000 mL | INTRAVENOUS | Status: DC
Start: 2022-12-05 — End: 2022-12-05

## 2022-12-05 MED ORDER — SUCRALFATE 1 GM/10ML PO SUSP
1.0000 g | Freq: Two times a day (BID) | ORAL | 1 refills | Status: AC
Start: 2022-12-05 — End: 2022-12-19

## 2022-12-05 NOTE — Progress Notes (Signed)
Pt. states no medical or surgical changes since previsit or office visit. 

## 2022-12-05 NOTE — Progress Notes (Signed)
Report to PACU, RN, vss, BBS= Clear.  

## 2022-12-05 NOTE — Progress Notes (Signed)
GASTROENTEROLOGY PROCEDURE H&P NOTE   Primary Care Physician: Billie Ruddy, MD  HPI: Patricia Barron is a 47 y.o. female who presents for EGD for evaluation of dysphagia and prior history of GIM.  Past Medical History:  Diagnosis Date   Asthma    Fibroids    GERD (gastroesophageal reflux disease)    Heart murmur    born with    Past Surgical History:  Procedure Laterality Date   none     UPPER GASTROINTESTINAL ENDOSCOPY     Current Outpatient Medications  Medication Sig Dispense Refill   albuterol (VENTOLIN HFA) 108 (90 Base) MCG/ACT inhaler Inhale 2 puffs into the lungs every 4 (four) hours as needed for wheezing or shortness of breath. 1 each 4   Multiple Vitamin (MULTIVITAMIN) tablet Take 1 tablet by mouth daily.     omeprazole (PRILOSEC) 40 MG capsule Take 1 capsule (40 mg total) by mouth 2 (two) times daily. Take one capsule 30-60 minutes before breakfast and dinner. 180 capsule 3   No current facility-administered medications for this visit.    Current Outpatient Medications:    albuterol (VENTOLIN HFA) 108 (90 Base) MCG/ACT inhaler, Inhale 2 puffs into the lungs every 4 (four) hours as needed for wheezing or shortness of breath., Disp: 1 each, Rfl: 4   Multiple Vitamin (MULTIVITAMIN) tablet, Take 1 tablet by mouth daily., Disp: , Rfl:    omeprazole (PRILOSEC) 40 MG capsule, Take 1 capsule (40 mg total) by mouth 2 (two) times daily. Take one capsule 30-60 minutes before breakfast and dinner., Disp: 180 capsule, Rfl: 3 No Known Allergies Family History  Problem Relation Age of Onset   Diabetes Mother    Hypertension Mother    Hyperlipidemia Father    Hypertension Father    Diabetes Maternal Grandmother    Kidney failure Maternal Grandmother    GER disease Maternal Grandmother    Colon cancer Neg Hx    Liver cancer Neg Hx    Stomach cancer Neg Hx    Rectal cancer Neg Hx    Esophageal cancer Neg Hx    Inflammatory bowel disease Neg Hx    Pancreatic  cancer Neg Hx    Colon polyps Neg Hx    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 There is no height or weight on file to calculate BMI. GEN: NAD EYE: Sclerae anicteric ENT: MMM CV: Non-tachycardic GI: Soft, NT/ND NEURO:  Alert & Oriented x 3  Lab Results: No results for input(s): "WBC", "HGB", "HCT", "PLT" in the last 72 hours. BMET No results for input(s): "NA", "K", "CL", "CO2", "GLUCOSE", "BUN", "CREATININE", "CALCIUM" in the last 72 hours. LFT No results for input(s): "PROT", "ALBUMIN", "AST", "ALT", "ALKPHOS", "BILITOT", "BILIDIR", "IBILI" in the last 72 hours. PT/INR No results for input(s): "LABPROT", "INR" in the last 72 hours.   Impression / Plan: This is a 47 y.o.female who presents for EGD for evaluation of dysphagia and prior history of GIM.  The risks and benefits of endoscopic evaluation/treatment were discussed with the patient and/or family; these include but are not limited to the risk of perforation, infection, bleeding, missed lesions, lack of diagnosis, severe illness  requiring hospitalization, as well as anesthesia and sedation related illnesses.  The patient's history has been reviewed, patient examined, no change in status, and deemed stable for procedure.  The patient and/or family is agreeable to proceed.    Justice Britain, MD [facility] Advanced Endoscopy Office # [id number]

## 2022-12-05 NOTE — Op Note (Signed)
[facility] Patient Name: Patricia Barron Procedure Date: 12/05/2022 10:17 AM MRN: [id number] Endoscopist: Justice Britain , MD, [id number] Age: 47 Referring MD:  Date of Birth: October 04, 1975 Gender: Female Account #: [id number] Procedure:                Upper GI endoscopy Indications:              Dysphagia, Follow-up of intestinal metaplasia Medicines:                Monitored Anesthesia Care Procedure:                Pre-Anesthesia Assessment:                           - Prior to the procedure, a History and Physical                            was performed, and patient medications and                            allergies were reviewed. The patient's tolerance of                            previous anesthesia was also reviewed. The risks                            and benefits of the procedure and the sedation                            options and risks were discussed with the patient.                            All questions were answered, and informed consent                            was obtained. Prior Anticoagulants: The patient has                            taken no anticoagulant or antiplatelet agents                            except for NSAID medication. ASA Grade Assessment:                            II - A patient with mild systemic disease. After                            reviewing the risks and benefits, the patient was                            deemed in satisfactory condition to undergo the                            procedure.  After obtaining informed consent, the endoscope was                            passed under direct vision. Throughout the                            procedure, the patient's blood pressure, pulse, and                            oxygen saturations were monitored continuously. The                            Olympus Scope [id number] was introduced through the                            mouth, and advanced to  the second part of duodenum.                            The upper GI endoscopy was accomplished without                            difficulty. The patient tolerated the procedure. Scope In: Scope Out: Findings:                 No gross lesions were noted in the entire esophagus.                           A non-obstructing Schatzki ring was found at the                            gastroesophageal junction.                           After the rest of the procedure was completed, a                            guidewire was placed and the scope was withdrawn.                            Dilation was performed in the esophagus with a                            Savary dilator with mild resistance at 18 mm. The                            dilation site was examined following endoscope                            reinsertion and showed just below the UES and at                            the Schatzki ring, moderate mucosal disruption,  moderate improvement in luminal narrowing and no                            perforation.                           A 1.5 cm hiatal hernia was present.                           No gross lesions were noted in the entire examined                            stomach. Gastric mapping was performed due to the                            patient's history of previous gastric intestinal                            metaplasia. Biopsies were taken with a cold forceps                            for histology from the cardia/fundus. Biopsies were                            taken with a cold forceps for histology greater                            curve/lesser curve of the body. Biopsies were taken                            with a cold forceps for histology from the antrum                            and incisura.                           No gross lesions were noted in the duodenal bulb,                            in the first portion of the duodenum and in  the                            second portion of the duodenum. Complications:            No immediate complications. Estimated Blood Loss:     Estimated blood loss was minimal. Impression:               - No gross lesions in the entire esophagus.                           - Non-obstructing Schatzki ring.                           - Dilation performed in the esophagus. Mucosal  wrents noted just below the UES and at the Schatzki                            ring.                           - 1.5 cm hiatal hernia.                           - No gross lesions in the entire stomach. Gastric                            mapping performed due to history of intestinal                            metaplasia.                           - No gross lesions in the duodenal bulb, in the                            first portion of the duodenum and in the second                            portion of the duodenum. Recommendation:           - The patient will be observed post-procedure,                            until all discharge criteria are met.                           - Discharge patient to home.                           - Patient has a contact number available for                            emergencies. The signs and symptoms of potential                            delayed complications were discussed with the                            patient. Return to normal activities tomorrow.                            Written discharge instructions were provided to the                            patient.                           - Dilation diet as per protocol.                           -  Please use Cepacol or Halls Lozenges +/-                            Chloraseptic spray for next 72-96 hours to aid in                            sore thoat should you experience this.                           - Start Carafate 1 g twice daily liquid or slurry                            for 2  weeks.                           - Continue present medications.                           - Observe patient's clinical course.                           - Await pathology results.                           - If patient does not have evidence of intestinal                            metaplasia on the gastric mapping at this time, we                            will end surveillance since she will have had 2                            endoscopies without recurrence of intestinal                            metaplasia. Otherwise of intestinal metaplasia                            still found, likely repeat endoscopy in 3 years                            will be recommended.                           - If patient's symptoms of dysphagia are treated                            and able to be maintained for a period of time but                            then start recurring we can consider repeat  dilation. If however, the patient does not have any                            improvement with her swallowing symptoms after our                            dilation, consider esophageal manometry.                           - Repeat upper endoscopy PRN for retreatment.                           - The findings and recommendations were discussed                            with the patient.                           - The findings and recommendations were discussed                            with the patient's family. Justice Britain, MD 12/05/2022 10:47:34 AM

## 2022-12-05 NOTE — Progress Notes (Signed)
Called to room to assist during endoscopic procedure.  Patient ID and intended procedure confirmed with present staff. Received instructions for my participation in the procedure from the performing physician.  

## 2022-12-05 NOTE — Patient Instructions (Addendum)
Post dilation diet given to patient - clear liquids for 2 hours and then soft diet for remainder of day, as tolerated! Please use Cepacol or Halls Lozenges +/- Chloraseptic spray for next 72-96 hours to aid in sore throat should you experience this  Start Carafate 1 gram twice a day liquid or slurry for 2 weeks - pick up prescription from [facility] off of [address] Continue present medications  Await pathology results Will wait on pathology results - likely repeat endoscopy in 3 years  Repeat upper endoscopy as needed for re-treatment    YOU HAD AN ENDOSCOPIC PROCEDURE TODAY AT [place]:   Refer to the procedure report that was given to you for any specific questions about what was found during the examination.  If the procedure report does not answer your questions, please call your gastroenterologist to clarify.  If you requested that your care partner not be given the details of your procedure findings, then the procedure report has been included in a sealed envelope for you to review at your convenience later.  YOU SHOULD EXPECT: Some feelings of bloating in the abdomen. Passage of more gas than usual.  Walking can help get rid of the air that was put into your GI tract during the procedure and reduce the bloating. If you had a lower endoscopy (such as a colonoscopy or flexible sigmoidoscopy) you may notice spotting of blood in your stool or on the toilet paper. If you underwent a bowel prep for your procedure, you may not have a normal bowel movement for a few days.  Please Note:  You might notice some irritation and congestion in your nose or some drainage.  This is from the oxygen used during your procedure.  There is no need for concern and it should clear up in a day or so.  SYMPTOMS TO REPORT IMMEDIATELY:  Following upper endoscopy (EGD)  Vomiting of blood or coffee ground material  New chest pain or pain under the shoulder blades  Painful  or persistently difficult swallowing  New shortness of breath  Fever of 100F or higher  Black, tarry-looking stools  For urgent or emergent issues, a gastroenterologist can be reached at any hour by calling [phone]. Do not use MyChart messaging for urgent concerns.    DIET:  We do recommend a small meal at first, but then you may proceed to your regular diet.  Drink plenty of fluids but you should avoid alcoholic beverages for 24 hours.  ACTIVITY:  You should plan to take it easy for the rest of today and you should NOT DRIVE or use heavy machinery until tomorrow (because of the sedation medicines used during the test).    FOLLOW UP: Our staff will call the number listed on your records the next business day following your procedure.  We will call around 7:15- 8:00 am to check on you and address any questions or concerns that you may have regarding the information given to you following your procedure. If we do not reach you, we will leave a message.     If any biopsies were taken you will be contacted by phone or by letter within the next 1-3 weeks.  Please call us at [phone] if you have not heard about the biopsies in 3 weeks.    SIGNATURES/CONFIDENTIALITY: You and/or your care partner have signed paperwork which will be entered into your electronic medical record.  These signatures attest to the fact  that that the information above on your After Visit Summary has been reviewed and is understood.  Full responsibility of the confidentiality of this discharge information lies with you and/or your care-partner.

## 2022-12-06 ENCOUNTER — Telehealth: Payer: Self-pay | Admitting: *Deleted

## 2022-12-06 NOTE — Telephone Encounter (Signed)
Post procedure follow up phone call. No answer at number given.  Left message on voicemail.  

## 2022-12-10 DIAGNOSIS — N83201 Unspecified ovarian cyst, right side: Secondary | ICD-10-CM | POA: Diagnosis not present

## 2022-12-10 DIAGNOSIS — Z01419 Encounter for gynecological examination (general) (routine) without abnormal findings: Secondary | ICD-10-CM | POA: Diagnosis not present

## 2022-12-10 DIAGNOSIS — Z1231 Encounter for screening mammogram for malignant neoplasm of breast: Secondary | ICD-10-CM | POA: Diagnosis not present

## 2022-12-12 ENCOUNTER — Other Ambulatory Visit: Payer: Self-pay | Admitting: Obstetrics and Gynecology

## 2022-12-12 ENCOUNTER — Encounter: Payer: Self-pay | Admitting: Gastroenterology

## 2022-12-12 DIAGNOSIS — N6310 Unspecified lump in the right breast, unspecified quadrant: Secondary | ICD-10-CM

## 2022-12-19 ENCOUNTER — Ambulatory Visit
Admission: RE | Admit: 2022-12-19 | Discharge: 2022-12-19 | Disposition: A | Discharge status: Home / Self Care | Payer: Federal, State, Local not specified - PPO | Source: Ambulatory Visit | Attending: Obstetrics and Gynecology | Admitting: Obstetrics and Gynecology

## 2022-12-19 DIAGNOSIS — N6011 Diffuse cystic mastopathy of right breast: Secondary | ICD-10-CM | POA: Diagnosis not present

## 2022-12-19 DIAGNOSIS — N6310 Unspecified lump in the right breast, unspecified quadrant: Secondary | ICD-10-CM

## 2022-12-19 DIAGNOSIS — R928 Other abnormal and inconclusive findings on diagnostic imaging of breast: Secondary | ICD-10-CM | POA: Diagnosis not present

## 2023-05-14 DIAGNOSIS — R519 Headache, unspecified: Secondary | ICD-10-CM | POA: Diagnosis not present

## 2023-05-14 DIAGNOSIS — R079 Chest pain, unspecified: Secondary | ICD-10-CM | POA: Diagnosis not present

## 2023-05-14 DIAGNOSIS — R0602 Shortness of breath: Secondary | ICD-10-CM | POA: Diagnosis not present

## 2023-05-14 DIAGNOSIS — R9431 Abnormal electrocardiogram [ECG] [EKG]: Secondary | ICD-10-CM | POA: Diagnosis not present

## 2023-05-14 DIAGNOSIS — R110 Nausea: Secondary | ICD-10-CM | POA: Diagnosis not present

## 2023-05-14 DIAGNOSIS — K219 Gastro-esophageal reflux disease without esophagitis: Secondary | ICD-10-CM | POA: Diagnosis not present

## 2023-05-15 DIAGNOSIS — R079 Chest pain, unspecified: Secondary | ICD-10-CM | POA: Diagnosis not present

## 2023-05-16 DIAGNOSIS — Z0000 Encounter for general adult medical examination without abnormal findings: Secondary | ICD-10-CM | POA: Diagnosis not present

## 2023-05-16 DIAGNOSIS — Z23 Encounter for immunization: Secondary | ICD-10-CM | POA: Diagnosis not present

## 2023-05-29 DIAGNOSIS — J45909 Unspecified asthma, uncomplicated: Secondary | ICD-10-CM | POA: Diagnosis not present

## 2023-06-14 DIAGNOSIS — S39012A Strain of muscle, fascia and tendon of lower back, initial encounter: Secondary | ICD-10-CM | POA: Diagnosis not present

## 2023-06-14 DIAGNOSIS — X501XXA Overexertion from prolonged static or awkward postures, initial encounter: Secondary | ICD-10-CM | POA: Diagnosis not present

## 2023-06-27 DIAGNOSIS — I10 Essential (primary) hypertension: Secondary | ICD-10-CM | POA: Diagnosis not present

## 2023-06-27 DIAGNOSIS — J4540 Moderate persistent asthma, uncomplicated: Secondary | ICD-10-CM | POA: Diagnosis not present

## 2023-08-05 DIAGNOSIS — M25531 Pain in right wrist: Secondary | ICD-10-CM | POA: Diagnosis not present

## 2023-10-29 ENCOUNTER — Other Ambulatory Visit: Payer: Self-pay | Admitting: Obstetrics and Gynecology

## 2023-10-29 DIAGNOSIS — Z1231 Encounter for screening mammogram for malignant neoplasm of breast: Secondary | ICD-10-CM

## 2023-12-11 DIAGNOSIS — Z1330 Encounter for screening examination for mental health and behavioral disorders, unspecified: Secondary | ICD-10-CM | POA: Diagnosis not present

## 2023-12-11 DIAGNOSIS — Z01419 Encounter for gynecological examination (general) (routine) without abnormal findings: Secondary | ICD-10-CM | POA: Diagnosis not present

## 2023-12-11 DIAGNOSIS — Z124 Encounter for screening for malignant neoplasm of cervix: Secondary | ICD-10-CM | POA: Diagnosis not present

## 2024-01-13 ENCOUNTER — Telehealth: Payer: Self-pay | Admitting: Physician Assistant

## 2024-01-13 NOTE — Telephone Encounter (Signed)
 Patient called and stated that she is needing a refill on her medication Omeprazole . Please advise.

## 2024-01-14 MED ORDER — OMEPRAZOLE 40 MG PO CPDR: 40.0000 mg | DELAYED_RELEASE_CAPSULE | Freq: Two times a day (BID) | ORAL | 3 refills | Status: AC

## 2024-01-14 NOTE — Telephone Encounter (Signed)
 Refill sent to pharmacy.

## 2024-02-10 ENCOUNTER — Other Ambulatory Visit: Payer: Self-pay

## 2024-02-10 ENCOUNTER — Emergency Department (HOSPITAL_COMMUNITY)

## 2024-02-10 ENCOUNTER — Emergency Department (HOSPITAL_COMMUNITY)
Admission: EM | Admit: 2024-02-10 | Discharge: 2024-02-10 | Disposition: A | Discharge status: Home / Self Care | Attending: Emergency Medicine | Admitting: Emergency Medicine

## 2024-02-10 ENCOUNTER — Encounter (HOSPITAL_COMMUNITY): Payer: Self-pay

## 2024-02-10 DIAGNOSIS — E860 Dehydration: Secondary | ICD-10-CM | POA: Insufficient documentation

## 2024-02-10 DIAGNOSIS — J45909 Unspecified asthma, uncomplicated: Secondary | ICD-10-CM | POA: Diagnosis not present

## 2024-02-10 DIAGNOSIS — R531 Weakness: Secondary | ICD-10-CM | POA: Diagnosis not present

## 2024-02-10 DIAGNOSIS — T675XXA Heat exhaustion, unspecified, initial encounter: Secondary | ICD-10-CM | POA: Diagnosis not present

## 2024-02-10 DIAGNOSIS — R079 Chest pain, unspecified: Secondary | ICD-10-CM | POA: Diagnosis not present

## 2024-02-10 DIAGNOSIS — T679XXA Effect of heat and light, unspecified, initial encounter: Secondary | ICD-10-CM | POA: Diagnosis not present

## 2024-02-10 DIAGNOSIS — R062 Wheezing: Secondary | ICD-10-CM | POA: Diagnosis not present

## 2024-02-10 LAB — CBC WITH DIFFERENTIAL/PLATELET
Abs Immature Granulocytes: 0.05 10*3/uL (ref 0.00–0.07)
Basophils Absolute: 0.1 10*3/uL (ref 0.0–0.1)
Basophils Relative: 1 %
Eosinophils Absolute: 0.1 10*3/uL (ref 0.0–0.5)
Eosinophils Relative: 1 %
HCT: 38.9 % (ref 36.0–46.0)
Hemoglobin: 12.7 g/dL (ref 12.0–15.0)
Immature Granulocytes: 0 %
Lymphocytes Relative: 26 %
Lymphs Abs: 3.2 10*3/uL (ref 0.7–4.0)
MCH: 28.9 pg (ref 26.0–34.0)
MCHC: 32.6 g/dL (ref 30.0–36.0)
MCV: 88.4 fL (ref 80.0–100.0)
Monocytes Absolute: 0.8 10*3/uL (ref 0.1–1.0)
Monocytes Relative: 7 %
Neutro Abs: 7.9 10*3/uL — ABNORMAL HIGH (ref 1.7–7.7)
Neutrophils Relative %: 65 %
Platelets: 255 10*3/uL (ref 150–400)
RBC: 4.4 MIL/uL (ref 3.87–5.11)
RDW: 14.6 % (ref 11.5–15.5)
WBC: 12 10*3/uL — ABNORMAL HIGH (ref 4.0–10.5)
nRBC: 0 % (ref 0.0–0.2)

## 2024-02-10 LAB — COMPREHENSIVE METABOLIC PANEL WITH GFR
ALT: 13 U/L (ref 0–44)
AST: 20 U/L (ref 15–41)
Albumin: 3.7 g/dL (ref 3.5–5.0)
Alkaline Phosphatase: 62 U/L (ref 38–126)
Anion gap: 9 (ref 5–15)
BUN: 8 mg/dL (ref 6–20)
CO2: 24 mmol/L (ref 22–32)
Calcium: 8.5 mg/dL — ABNORMAL LOW (ref 8.9–10.3)
Chloride: 105 mmol/L (ref 98–111)
Creatinine, Ser: 1.04 mg/dL — ABNORMAL HIGH (ref 0.44–1.00)
GFR, Estimated: >60 mL/min (ref >60)
Glucose, Bld: 128 mg/dL — ABNORMAL HIGH (ref 70–99)
Potassium: 3.5 mmol/L (ref 3.5–5.1)
Sodium: 138 mmol/L (ref 135–145)
Total Bilirubin: 0.5 mg/dL (ref 0.0–1.2)
Total Protein: 7.1 g/dL (ref 6.5–8.1)

## 2024-02-10 LAB — TROPONIN I (HIGH SENSITIVITY): Troponin I (High Sensitivity): <2 ng/L (ref <18)

## 2024-02-10 MED ORDER — ACETAMINOPHEN 500 MG PO TABS
1000.0000 mg | ORAL_TABLET | Freq: Once | ORAL | Status: AC
  Administered 2024-02-10: 1000 mg via ORAL
  Filled 2024-02-10: qty 2

## 2024-02-10 MED ORDER — LACTATED RINGERS IV BOLUS
1000.0000 mL | Freq: Once | INTRAVENOUS | Status: AC
  Administered 2024-02-10: 1000 mL via INTRAVENOUS

## 2024-02-10 NOTE — ED Provider Notes (Signed)
 [place] EMERGENCY DEPARTMENT AT [facility] Provider Note   CSN: [id number] Arrival date & time: 02/10/24  1528     History {Add pertinent medical, surgical, social history, OB history to HPI:1} Chief Complaint  Patient presents with   Weakness   Heat Exposure    Patricia Barron is a 48 y.o. female.  Patient is a 48 year old female with a history of asthma and GERD who is presenting today with multiple symptoms from work.  Patient reports that she felt okay when she woke up this morning however when she was walking into work today she had a tightness in the center of her chest with some nausea that felt like indigestion.  She reports she had some peanut butter crackers and the nausea went away but the tightness was still somewhat present however at work the air conditioner was broken it was approximately 80 degrees in the building and she started to just feel very hot.  She became dizzy and lightheaded and felt like she was having trouble breathing.  She used her inhaler but did not feel that it helped and had to lay down which did help some.  She denies syncope because she was able to lay down but reports now she is having a headache and just feels generally weak.  She denies any chest pain or shortness of breath currently.  She has no prior history of cardiac issues other than a heart murmur denies history of high blood pressure or taking blood pressure medication.  She does report there is some mild discomfort in her lower thoracic back that does not radiate.  She denies any vomiting or diarrhea.  No urinary complaints.  No unilateral leg pain or swelling.  No recent immobilization or surgeries.  No recent medication changes.  She denies alcohol or drugs.  She does not use tobacco products.  The history is provided by the patient.  Weakness      Home Medications Prior to Admission medications   Medication Sig Start Date End Date Taking? Authorizing Provider  albuterol   (VENTOLIN  HFA) 108 (90 Base) MCG/ACT inhaler Inhale 2 puffs into the lungs every 4 (four) hours as needed for wheezing or shortness of breath. 09/20/21   Viola Greulich, MD  ibuprofen  (ADVIL ) 800 MG tablet every 8 (eight) hours as needed.    [provider]  levonorgestrel  (MIRENA , 52 MG,) 20 MCG/DAY IUD by Intrauterine route. 11/10/19   [provider]  Multiple Vitamin (MULTIVITAMIN) tablet Take 1 tablet by mouth daily.    [provider]  omeprazole  (PRILOSEC) 40 MG capsule Take 1 capsule (40 mg total) by mouth 2 (two) times daily. Take one capsule 30-60 minutes before breakfast and dinner. 01/14/24   Graciella Lavender, PA  sucralfate  (CARAFATE ) 1 GM/10ML suspension Take 10 mLs (1 g total) by mouth 2 (two) times daily for 14 days. 12/05/22 12/19/22  Mansouraty, Albino Alu., MD  Vitamin D, Ergocalciferol, (DRISDOL) 1.25 MG (50000 UNIT) CAPS capsule Take by mouth 1 day or 1 dose. 10/15/20   [provider]      Allergies    Patient has no known allergies.    Review of Systems   Review of Systems  Neurological:  Positive for weakness.    Physical Exam Updated Vital Signs BP (!) 161/93   Pulse (!) 105   Temp 97.9 F (36.6 C) (Oral)   Resp 15   Ht 5\' 1"  (1.549 m)   Wt 77.1 kg   SpO2  93%   BMI 32.12 kg/m  Physical Exam Vitals and nursing note reviewed.  Constitutional:      General: She is not in acute distress.    Appearance: She is well-developed.  HENT:     Head: Normocephalic and atraumatic.  Eyes:     Pupils: Pupils are equal, round, and reactive to light.  Cardiovascular:     Rate and Rhythm: Regular rhythm. Tachycardia present.     Pulses: Normal pulses.     Heart sounds: Murmur heard.     No friction rub.  Pulmonary:     Effort: Pulmonary effort is normal.     Breath sounds: Normal breath sounds. No wheezing or rales.  Chest:     Chest wall: No tenderness.  Abdominal:     General: Bowel sounds are normal. There is no distension.      Palpations: Abdomen is soft.     Tenderness: There is no abdominal tenderness. There is no guarding or rebound.  Musculoskeletal:        General: No tenderness. Normal range of motion.     Right lower leg: No edema.     Left lower leg: No edema.     Comments: No edema  Skin:    General: Skin is warm and dry.     Findings: No rash.  Neurological:     Mental Status: She is alert and oriented to person, place, and time. Mental status is at baseline.     Cranial Nerves: No cranial nerve deficit.  Psychiatric:        Behavior: Behavior normal.     ED Results / Procedures / Treatments   Labs (all labs ordered are listed, but only abnormal results are displayed) Labs Reviewed  CBC WITH DIFFERENTIAL/PLATELET  COMPREHENSIVE METABOLIC PANEL WITH GFR  TROPONIN I (HIGH SENSITIVITY)    EKG None  Radiology No results found.  Procedures Procedures  {Document cardiac monitor, telemetry assessment procedure when appropriate:1}  Medications Ordered in ED Medications  lactated ringers bolus 1,000 mL (has no administration in time range)  acetaminophen  (TYLENOL ) tablet 1,000 mg (has no administration in time range)    ED Course/ Medical Decision Making/ A&P   {   Click here for ABCD2, HEART and other calculatorsREFRESH Note before signing :1}                              Medical Decision Making Amount and/or Complexity of Data Reviewed Labs: ordered. Radiology: ordered.  Risk OTC drugs.   Pt with multiple medical problems and comorbidities and presenting today with a complaint that caries a high risk for morbidity and mortality.  Here today with the above complaints.  Concern for dehydration, heat exhaustion, ACS, electrolyte abnormalities.  Lower suspicion for dissection, PE, myocarditis, pericardial effusion.  Patient is well-appearing on exam and is not having any wheezing at this time concerning for asthma flare and low suspicion for pneumonia or infectious etiology.  I  independently interpreted patient's EKG and labs.  EKG shows a sinus rhythm with no acute findings. Patient given IV fluids and will reevaluate.   {Document critical care time when appropriate:1} {Document review of labs and clinical decision tools ie heart score, Chads2Vasc2 etc:1}  {Document your independent review of radiology images, and any outside records:1} {Document your discussion with family members, caretakers, and with consultants:1} {Document social determinants of health affecting pt's care:1} {Document your decision making why or why not admission,  treatments were needed:1} Final Clinical Impression(s) / ED Diagnoses Final diagnoses:  None    Rx / DC Orders ED Discharge Orders     None

## 2024-02-10 NOTE — Discharge Instructions (Addendum)
 You were dehydrated today but everything with your heart and kidneys looks ok.  Stay hydrated and rest at home.  Avoid the heat.  If you start having worsening chest pain, shortness of breath or passing out return to the ER.

## 2024-02-10 NOTE — ED Triage Notes (Signed)
 Pt BIB EMS from work due to [employer] and weakness. No A/C at work, worked for 7 hours now. With EMS pt c/o dizziness, SOB, weakness, lightheadedness, chest pain, and no LOC. On arrival pt c/o only weakness and headache other symptoms subsided. Pt was concerned about asthma flaring up due to heat. Pt was given an Albuterol  treatment from [employer]. Pt reports drink water to still hydrated during shift.  20 ga LAC 500 mL NS NaCl Bolus BP 140/90 SpO2 100%

## 2024-04-09 DIAGNOSIS — R051 Acute cough: Secondary | ICD-10-CM | POA: Diagnosis not present

## 2024-04-09 DIAGNOSIS — U071 COVID-19: Secondary | ICD-10-CM | POA: Diagnosis not present

## 2024-06-10 DIAGNOSIS — M9903 Segmental and somatic dysfunction of lumbar region: Secondary | ICD-10-CM | POA: Diagnosis not present

## 2024-06-10 DIAGNOSIS — M62830 Muscle spasm of back: Secondary | ICD-10-CM | POA: Diagnosis not present

## 2024-06-10 DIAGNOSIS — M9901 Segmental and somatic dysfunction of cervical region: Secondary | ICD-10-CM | POA: Diagnosis not present

## 2024-06-10 DIAGNOSIS — M545 Low back pain, unspecified: Secondary | ICD-10-CM | POA: Diagnosis not present

## 2024-06-24 DIAGNOSIS — K080 Exfoliation of teeth due to systemic causes: Secondary | ICD-10-CM | POA: Diagnosis not present

## 2024-07-27 DIAGNOSIS — Z13220 Encounter for screening for lipoid disorders: Secondary | ICD-10-CM | POA: Diagnosis not present

## 2024-07-27 DIAGNOSIS — E669 Obesity, unspecified: Secondary | ICD-10-CM | POA: Diagnosis not present

## 2024-07-27 DIAGNOSIS — K219 Gastro-esophageal reflux disease without esophagitis: Secondary | ICD-10-CM | POA: Diagnosis not present

## 2024-07-27 DIAGNOSIS — M5430 Sciatica, unspecified side: Secondary | ICD-10-CM | POA: Diagnosis not present

## 2024-07-27 DIAGNOSIS — J4520 Mild intermittent asthma, uncomplicated: Secondary | ICD-10-CM | POA: Diagnosis not present

## 2024-07-27 DIAGNOSIS — Z0000 Encounter for general adult medical examination without abnormal findings: Secondary | ICD-10-CM | POA: Diagnosis not present

## 2024-08-07 DIAGNOSIS — J40 Bronchitis, not specified as acute or chronic: Secondary | ICD-10-CM | POA: Diagnosis not present

## 2024-08-25 DIAGNOSIS — M25562 Pain in left knee: Secondary | ICD-10-CM | POA: Diagnosis not present

## 2024-08-25 DIAGNOSIS — M1712 Unilateral primary osteoarthritis, left knee: Secondary | ICD-10-CM | POA: Diagnosis not present
